# Patient Record
Sex: Female | Born: 1950 | Race: White | Hispanic: No | Marital: Married | Smoking: Current every day smoker
Health system: Southern US, Community
[De-identification: ages and names within clinical notes are randomized; demographics above are authoritative.]

## PROBLEM LIST (undated history)

## (undated) DIAGNOSIS — I10 Essential (primary) hypertension: Secondary | ICD-10-CM

## (undated) DIAGNOSIS — E785 Hyperlipidemia, unspecified: Secondary | ICD-10-CM

## (undated) DIAGNOSIS — Z972 Presence of dental prosthetic device (complete) (partial): Secondary | ICD-10-CM

## (undated) DIAGNOSIS — F419 Anxiety disorder, unspecified: Secondary | ICD-10-CM

## (undated) DIAGNOSIS — H9190 Unspecified hearing loss, unspecified ear: Secondary | ICD-10-CM

## (undated) DIAGNOSIS — R200 Anesthesia of skin: Secondary | ICD-10-CM

## (undated) DIAGNOSIS — M1990 Unspecified osteoarthritis, unspecified site: Secondary | ICD-10-CM

## (undated) HISTORY — PX: COLONOSCOPY: SHX174

## (undated) HISTORY — DX: Anxiety disorder, unspecified: F41.9

## (undated) HISTORY — DX: Hyperlipidemia, unspecified: E78.5

## (undated) HISTORY — DX: Unspecified osteoarthritis, unspecified site: M19.90

## (undated) HISTORY — DX: Essential (primary) hypertension: I10

---

## 1979-03-14 HISTORY — PX: TUBAL LIGATION: SHX77

## 2006-04-03 DIAGNOSIS — Z79890 Hormone replacement therapy (postmenopausal): Secondary | ICD-10-CM | POA: Insufficient documentation

## 2007-07-16 LAB — HM DEXA SCAN

## 2008-05-28 ENCOUNTER — Ambulatory Visit: Payer: Self-pay | Admitting: Family Medicine

## 2008-05-28 LAB — HM PAP SMEAR

## 2008-06-03 ENCOUNTER — Ambulatory Visit: Payer: Self-pay | Admitting: Family Medicine

## 2009-12-01 ENCOUNTER — Ambulatory Visit: Payer: Self-pay | Admitting: Family Medicine

## 2014-07-02 ENCOUNTER — Emergency Department
Admit: 2014-07-02 | Disposition: A | Discharge status: Home / Self Care | Payer: Self-pay | Admitting: Emergency Medicine

## 2014-07-02 LAB — BASIC METABOLIC PANEL
ANION GAP: 4 — AB (ref 7–16)
BUN: 13 mg/dL
CHLORIDE: 109 mmol/L
CREATININE: 0.64 mg/dL
Calcium, Total: 9.2 mg/dL
Co2: 27 mmol/L
EGFR (African American): >60
EGFR (Non-African Amer.): >60
Glucose: 100 mg/dL — ABNORMAL HIGH
Potassium: 3.8 mmol/L
Sodium: 140 mmol/L

## 2014-07-02 LAB — TROPONIN I: Troponin-I: <0.03 ng/mL

## 2014-12-29 ENCOUNTER — Ambulatory Visit (INDEPENDENT_AMBULATORY_CARE_PROVIDER_SITE_OTHER): Payer: Managed Care, Other (non HMO) | Admitting: Family Medicine

## 2014-12-29 ENCOUNTER — Ambulatory Visit
Admission: RE | Admit: 2014-12-29 | Discharge: 2014-12-29 | Disposition: A | Discharge status: Home / Self Care | Payer: 59 | Source: Ambulatory Visit | Attending: Family Medicine | Admitting: Family Medicine

## 2014-12-29 ENCOUNTER — Encounter: Payer: Self-pay | Admitting: Family Medicine

## 2014-12-29 VITALS — BP 160/90 | HR 76 | Temp 98.5°F | Resp 16 | Wt 132.6 lb

## 2014-12-29 DIAGNOSIS — R0989 Other specified symptoms and signs involving the circulatory and respiratory systems: Secondary | ICD-10-CM | POA: Insufficient documentation

## 2014-12-29 DIAGNOSIS — K2100 Gastro-esophageal reflux disease with esophagitis, without bleeding: Secondary | ICD-10-CM

## 2014-12-29 DIAGNOSIS — E7800 Pure hypercholesterolemia, unspecified: Secondary | ICD-10-CM

## 2014-12-29 DIAGNOSIS — R05 Cough: Secondary | ICD-10-CM | POA: Insufficient documentation

## 2014-12-29 DIAGNOSIS — R1310 Dysphagia, unspecified: Secondary | ICD-10-CM

## 2014-12-29 DIAGNOSIS — J440 Chronic obstructive pulmonary disease with acute lower respiratory infection: Secondary | ICD-10-CM

## 2014-12-29 DIAGNOSIS — J449 Chronic obstructive pulmonary disease, unspecified: Secondary | ICD-10-CM | POA: Insufficient documentation

## 2014-12-29 DIAGNOSIS — I10 Essential (primary) hypertension: Secondary | ICD-10-CM

## 2014-12-29 DIAGNOSIS — J329 Chronic sinusitis, unspecified: Secondary | ICD-10-CM

## 2014-12-29 DIAGNOSIS — Z720 Tobacco use: Secondary | ICD-10-CM

## 2014-12-29 DIAGNOSIS — F419 Anxiety disorder, unspecified: Secondary | ICD-10-CM

## 2014-12-29 MED ORDER — PROMETHAZINE-DM 6.25-15 MG/5ML PO SYRP: 5.0000 mL | ORAL_SOLUTION | Freq: Four times a day (QID) | ORAL | Status: DC | PRN

## 2014-12-29 MED ORDER — ALBUTEROL SULFATE HFA 108 (90 BASE) MCG/ACT IN AERS: 2.0000 | INHALATION_SPRAY | Freq: Four times a day (QID) | RESPIRATORY_TRACT | Status: DC | PRN

## 2014-12-29 MED ORDER — AZITHROMYCIN 250 MG PO TABS: ORAL_TABLET | ORAL | Status: DC

## 2014-12-29 NOTE — Progress Notes (Signed)
Patient ID: Amanda Nelson, female   DOB: 1950/10/23, 64 y.o.   MRN: [id number]       Patient: Amanda Nelson Female    DOB: [birth year]   64 y.o.   MRN: [id number] Visit Date: 12/29/2014  Today's Provider: Dortha Kern, PA   Chief Complaint  Patient presents with  . Cough    pt stated that she knows that she has bronchitis, pt started last week on 12/23/2014. Pt reports taking Alka-Seltzer plus, sudafed, zinc and vitamin C with no relief. pt reports no fever. unable to cough up the sputum. pt reports back and rib soreness from coughing.   Subjective:    Cough This is a new problem. The current episode started in the past 7 days. The problem has been gradually worsening. The problem occurs constantly. The cough is productive of sputum (yellow color). Associated symptoms include chills, headaches, nasal congestion, shortness of breath, sweats and wheezing. Pertinent negatives include no sore throat. Nothing aggravates the symptoms. Treatments tried: Alka-Seltzer plus, sudafed, zinc and vitamin C. The treatment provided no relief. Her past medical history is significant for bronchitis.   Patient Active Problem List   Diagnosis Date Noted  . Chronic sinusitis 12/29/2014  . Dysphagia 12/29/2014  . Esophagitis, reflux 12/29/2014  . Tobacco use 12/29/2014  . Anxiety disorder 12/29/2014  . Essential hypertension 12/29/2014  . Pure hypercholesterolemia 12/29/2014  . Need for prophylactic hormone replacement therapy (postmenopausal) 04/03/2006   Past Surgical History  Procedure Laterality Date  . Tubal ligation  1981   Family History  Problem Relation Age of Onset  . Stroke Brother   . Cancer Brother   . Cancer Sister     cancer   No Known Allergies  Previous Medications   OMEGA-3 FATTY ACIDS (FISH OIL PO)    Take by mouth.   Social History  Substance Use Topics  . Smoking status: Smoker, Current Status Unknown -- 1.00 packs/day for 30 years    Types: Cigarettes    . Smokeless tobacco: Never Used  . Alcohol Use: No    Review of Systems  Constitutional: Positive for chills.  HENT: Negative for sore throat.   Respiratory: Positive for cough, shortness of breath and wheezing.   Neurological: Positive for headaches.   Objective:   BP 160/90 mmHg  Pulse 76  Temp(Src) 98.5 F (36.9 C) (Oral)  Resp 16  Wt 132 lb 9.6 oz (60.147 kg)  SpO2 93%  Physical Exam  Constitutional: She is oriented to person, place, and time. She appears well-developed and well-nourished.  HENT:  Head: Normocephalic and atraumatic.  Right Ear: Tympanic membrane is retracted.  Left Ear: External ear normal.  Nose: Nose normal.  Mouth/Throat: Oropharynx is clear and moist.  Eyes: Conjunctivae are normal. Pupils are equal, round, and reactive to light.  Neck: Normal range of motion. Neck supple.  Cardiovascular: Normal rate, regular rhythm and normal heart sounds.   Pulmonary/Chest: She has rales.  Both posterior bases.  Abdominal: Soft. Bowel sounds are normal.  Musculoskeletal: Normal range of motion.  Lymphadenopathy:    She has no cervical adenopathy.  Neurological: She is alert and oriented to person, place, and time.  Psychiatric: She has a normal mood and affect. Her behavior is normal.      Assessment & Plan:     1. Rales Onset with cough and purulent sputum production the past week. Some chills initially but no fever today. Short of breath with exertion and urinary stress incontinence with cough.  Suspect basilar pneumonia versus acute exacerbation of COPD. Will start antibiotic, bronchodilator and cough syrup. Scheduled for CXR and CBC with diff. Recheck pending reports. Out of work for 2-3 days. - azithromycin (ZITHROMAX) 250 MG tablet; Two tablets by mouth today then one daily for 4 days  Dispense: 6 tablet; Refill: 0 - promethazine-dextromethorphan (PROMETHAZINE-DM) 6.25-15 MG/5ML syrup; Take 5 mLs by mouth 4 (four) times daily as needed for cough.   Dispense: 118 mL; Refill: 0 - CBC with Differential/Platelet - DG Chest 2 View  2. Chronic obstructive pulmonary disease with acute lower respiratory infection (HCC) Still smoking 1/2 to 1 ppd and has been for 37 years. Encouraged to stop all smoking and given bronchodilator to help control wheezing. - albuterol (PROVENTIL HFA;VENTOLIN HFA) 108 (90 BASE) MCG/ACT inhaler; Inhale 2 puffs into the lungs every 6 (six) hours as needed for wheezing or shortness of breath.  Dispense: 1 Inhaler; Refill: 3  3. Essential hypertension Has been off all medications for years. Went to ER on 07-02-14 for very high BP and was evaluated for possible stroke. States all tests were normal. Was given Lisinopril 5 mg qd but did not take it. Recommend she limit sodium intake and stop all smoking. Recheck BP in a week once congestion treated. Should not use decongestants and limit caffeine intake.       Dortha Kern, PA  [facility]

## 2014-12-30 LAB — CBC WITH DIFFERENTIAL/PLATELET
BASOS ABS: 0.1 10*3/uL (ref 0.0–0.2)
Basos: 1 %
EOS (ABSOLUTE): 0.2 10*3/uL (ref 0.0–0.4)
Eos: 2 %
Hematocrit: 42.5 % (ref 34.0–46.6)
Hemoglobin: 14.5 g/dL (ref 11.1–15.9)
Immature Grans (Abs): 0 10*3/uL (ref 0.0–0.1)
Immature Granulocytes: 0 %
LYMPHS ABS: 2.7 10*3/uL (ref 0.7–3.1)
Lymphs: 28 %
MCH: 31.6 pg (ref 26.6–33.0)
MCHC: 34.1 g/dL (ref 31.5–35.7)
MCV: 93 fL (ref 79–97)
Monocytes Absolute: 1 10*3/uL — ABNORMAL HIGH (ref 0.1–0.9)
Monocytes: 10 %
NEUTROS ABS: 5.8 10*3/uL (ref 1.4–7.0)
Neutrophils: 59 %
PLATELETS: 260 10*3/uL (ref 150–379)
RBC: 4.59 x10E6/uL (ref 3.77–5.28)
RDW: 14.2 % (ref 12.3–15.4)
WBC: 9.7 10*3/uL (ref 3.4–10.8)

## 2014-12-31 ENCOUNTER — Telehealth: Payer: Self-pay

## 2014-12-31 NOTE — Telephone Encounter (Signed)
Patient advised as directed below. Patient verbalized understanding and agrees with plan of care. Patient scheduled a follow up appointment for spirometry test on 01/12/2015.

## 2014-12-31 NOTE — Telephone Encounter (Signed)
-----   Message from [place], [place] sent at 12/31/2014  2:05 AM EDT ----- Normal blood cell counts. No sign of serious infection. Probably an acute exacerbation bronchitis due to COPD and smoking. Finish all the antibiotic and schedule for spirometry lung test.

## 2014-12-31 NOTE — Telephone Encounter (Signed)
-----   Message from [place], [place] sent at 12/29/2014  5:44 PM EDT ----- [place] shows a mild basilar opacity at the right lung base but no significant pneumonia. Take all the antibiotic and awaiting report of blood count.

## 2014-12-31 NOTE — Telephone Encounter (Signed)
-----   Message from [place], [place] sent at 12/29/2014  5:44 PM EDT ----- [place] shows a mild basilar opacity at the right lung base but no significant pneumonia. Take all the antibiotic and awaiting report of blood count.

## 2015-01-12 ENCOUNTER — Ambulatory Visit (INDEPENDENT_AMBULATORY_CARE_PROVIDER_SITE_OTHER): Payer: Managed Care, Other (non HMO) | Admitting: Family Medicine

## 2015-01-12 ENCOUNTER — Encounter: Payer: Self-pay | Admitting: Family Medicine

## 2015-01-12 VITALS — BP 130/78 | HR 70 | Temp 98.0°F | Resp 18 | Ht 60.0 in | Wt 136.0 lb

## 2015-01-12 DIAGNOSIS — J441 Chronic obstructive pulmonary disease with (acute) exacerbation: Secondary | ICD-10-CM | POA: Insufficient documentation

## 2015-01-12 DIAGNOSIS — J449 Chronic obstructive pulmonary disease, unspecified: Secondary | ICD-10-CM | POA: Insufficient documentation

## 2015-01-12 DIAGNOSIS — J440 Chronic obstructive pulmonary disease with acute lower respiratory infection: Secondary | ICD-10-CM

## 2015-01-12 MED ORDER — MOMETASONE FURO-FORMOTEROL FUM 100-5 MCG/ACT IN AERO: 2.0000 | INHALATION_SPRAY | Freq: Two times a day (BID) | RESPIRATORY_TRACT | Status: DC

## 2015-01-12 NOTE — Progress Notes (Signed)
Patient ID: Amanda Nelson, female   DOB: 04/30/1950, 64 y.o.   MRN: [id number]    Subjective:  HPI COPD:  Follow up cough due to exacerbation of COPD from an acute respiratory infection. Finished Z-pak and using less Promethazine-DM cough syrup. Has cut back on use of the Albuterol because she thought it was running out (mistook gram size of canister for the number of inhalations remaining). No fever and very little head congestion remaining. Cough and sputum production worse in the mornings. Sputum thick and white colored now.  Prior to Admission medications   Medication Sig Start Date End Date Taking? Authorizing Provider  albuterol (PROVENTIL HFA;VENTOLIN HFA) 108 (90 BASE) MCG/ACT inhaler Inhale 2 puffs into the lungs every 6 (six) hours as needed for wheezing or shortness of breath. 12/29/14  Yes Dennis E Chrismon, PA  Omega-3 Fatty Acids (FISH OIL PO) Take by mouth.   Yes Historical Provider, MD  esomeprazole (NEXIUM) 40 MG capsule Take 40 mg by mouth daily at 12 noon.    Historical Provider, MD  fluticasone (FLONASE) 50 MCG/ACT nasal spray Place 2 sprays into both nostrils at bedtime.    Historical Provider, MD  metoprolol tartrate (LOPRESSOR) 25 MG tablet Take 25 mg by mouth daily.    Historical Provider, MD  rosuvastatin (CRESTOR) 5 MG tablet Take 5 mg by mouth daily.    Historical Provider, MD   Patient Active Problem List   Diagnosis Date Noted  . Chronic sinusitis 12/29/2014  . Dysphagia 12/29/2014  . Esophagitis, reflux 12/29/2014  . Tobacco use 12/29/2014  . Anxiety disorder 12/29/2014  . Essential hypertension 12/29/2014  . Pure hypercholesterolemia 12/29/2014  . Need for prophylactic hormone replacement therapy (postmenopausal) 04/03/2006   Past Medical History  Diagnosis Date  . Anxiety   . Hypertension   . Hyperlipidemia    Social History   Social History  . Marital Status: Married    Spouse Name: N/A  . Number of Children: N/A  . Years of Education: N/A    Occupational History  . Not on file.   Social History Main Topics  . Smoking status: Current Every Day Smoker -- 1.00 packs/day for 30 years    Types: Cigarettes  . Smokeless tobacco: Never Used  . Alcohol Use: No  . Drug Use: No  . Sexual Activity: Not on file   Other Topics Concern  . Not on file   Social History Narrative   No Known Allergies  Review of Systems  Constitutional: Negative.   HENT: Negative.   Eyes: Negative.   Respiratory: Positive for cough.   Cardiovascular: Negative.   Gastrointestinal: Negative.   Genitourinary: Negative.   Musculoskeletal: Negative.   Skin: Negative.   Neurological: Negative.   Endo/Heme/Allergies: Negative.   Psychiatric/Behavioral: Negative.    Objective:  BP 130/78 mmHg  Pulse 70  Temp(Src) 98 F (36.7 C) (Oral)  Resp 18  Wt 136 lb (61.689 kg)  SpO2 98% Body mass index is 26.56 kg/(m^2).  Physical Exam  Constitutional: She is oriented to person, place, and time and well-developed, well-nourished, and in no distress.  Eyes: Conjunctivae and EOM are normal.  Neck: Neck supple.  Cardiovascular: Normal rate and regular rhythm.   Pulmonary/Chest: Effort normal and breath sounds normal. She has no rales.  Abdominal: Bowel sounds are normal.  Neurological: She is alert and oriented to person, place, and time.    Lab Results  Component Value Date   WBC 9.7 12/29/2014   HCT 42.5 12/29/2014  GLUCOSE 100* 07/02/2014   CMP     Component Value Date/Time   NA 140 07/02/2014 0926   K 3.8 07/02/2014 0926   CL 109 07/02/2014 0926   CO2 27 07/02/2014 0926   GLUCOSE 100* 07/02/2014 0926   BUN 13 07/02/2014 0926   CREATININE 0.64 07/02/2014 0926   CALCIUM 9.2 07/02/2014 0926   GFRNONAA >60 07/02/2014 0926   GFRAA >60 07/02/2014 0926   Assessment and Plan :   1. Chronic obstructive pulmonary disease with acute lower respiratory infection (HCC) Improvement in cough and congestion. Not having to use the Albuterol as  frequent now and has finished the Z-pak. Spirometry confirms severe airway obstructive disease with a lung age >84 years. Will add Dulera routinely and consider Spiriva pending follow up in a month. - Spirometry with graph - mometasone-formoterol (DULERA) 100-5 MCG/ACT AERO; Inhale 2 puffs into the lungs 2 (two) times daily.  Dispense: 8.8 g; Refill: 0   Dortha Kern PA [facility] 01/12/2015 8:22 AM

## 2015-01-12 NOTE — Patient Instructions (Signed)
Chronic Obstructive Pulmonary Disease Chronic obstructive pulmonary disease (COPD) is a common lung condition in which airflow from the lungs is limited. COPD is a general term that can be used to describe many different lung problems that limit airflow, including both chronic bronchitis and emphysema. If you have COPD, your lung function will probably never return to normal, but there are measures you can take to improve lung function and make yourself feel better. CAUSES   Smoking (common).  Exposure to secondhand smoke.  Genetic problems.  Chronic inflammatory lung diseases or recurrent infections. SYMPTOMS  Shortness of breath, especially with physical activity.  Deep, persistent (chronic) cough with a large amount of thick mucus.  Wheezing.  Rapid breaths (tachypnea).  Gray or bluish discoloration (cyanosis) of the skin, especially in your fingers, toes, or lips.  Fatigue.  Weight loss.  Frequent infections or episodes when breathing symptoms become much worse (exacerbations).  Chest tightness. DIAGNOSIS Your health care provider will take a medical history and perform a physical examination to diagnose COPD. Additional tests for COPD may include:  Lung (pulmonary) function tests.  Chest X-ray.  CT scan.  Blood tests. TREATMENT  Treatment for COPD may include:  Inhaler and nebulizer medicines. These help manage the symptoms of COPD and make your breathing more comfortable.  Supplemental oxygen. Supplemental oxygen is only helpful if you have a low oxygen level in your blood.  Exercise and physical activity. These are beneficial for nearly all people with COPD.  Lung surgery or transplant.  Nutrition therapy to gain weight, if you are underweight.  Pulmonary rehabilitation. This may involve working with a team of health care providers and specialists, such as respiratory, occupational, and physical therapists. HOME CARE INSTRUCTIONS  Take all medicines  (inhaled or pills) as directed by your health care provider.  Avoid over-the-counter medicines or cough syrups that dry up your airway (such as antihistamines) and slow down the elimination of secretions unless instructed otherwise by your health care provider.  If you are a smoker, the most important thing that you can do is stop smoking. Continuing to smoke will cause further lung damage and breathing trouble. Ask your health care provider for help with quitting smoking. He or she can direct you to community resources or hospitals that provide support.  Avoid exposure to irritants such as smoke, chemicals, and fumes that aggravate your breathing.  Use oxygen therapy and pulmonary rehabilitation if directed by your health care provider. If you require home oxygen therapy, ask your health care provider whether you should purchase a pulse oximeter to measure your oxygen level at home.  Avoid contact with individuals who have a contagious illness.  Avoid extreme temperature and humidity changes.  Eat healthy foods. Eating smaller, more frequent meals and resting before meals may help you maintain your strength.  Stay active, but balance activity with periods of rest. Exercise and physical activity will help you maintain your ability to do things you want to do.  Preventing infection and hospitalization is very important when you have COPD. Make sure to receive all the vaccines your health care provider recommends, especially the pneumococcal and influenza vaccines. Ask your health care provider whether you need a pneumonia vaccine.  Learn and use relaxation techniques to manage stress.  Learn and use controlled breathing techniques as directed by your health care provider. Controlled breathing techniques include:  Pursed lip breathing. Start by breathing in (inhaling) through your nose for 1 second. Then, purse your lips as if you were   going to whistle and breathe out (exhale) through the  pursed lips for 2 seconds.  Diaphragmatic breathing. Start by putting one hand on your abdomen just above your waist. Inhale slowly through your nose. The hand on your abdomen should move out. Then purse your lips and exhale slowly. You should be able to feel the hand on your abdomen moving in as you exhale.  Learn and use controlled coughing to clear mucus from your lungs. Controlled coughing is a series of short, progressive coughs. The steps of controlled coughing are: 1. Lean your head slightly forward. 2. Breathe in deeply using diaphragmatic breathing. 3. Try to hold your breath for 3 seconds. 4. Keep your mouth slightly open while coughing twice. 5. Spit any mucus out into a tissue. 6. Rest and repeat the steps once or twice as needed. SEEK MEDICAL CARE IF:  You are coughing up more mucus than usual.  There is a change in the color or thickness of your mucus.  Your breathing is more labored than usual.  Your breathing is faster than usual. SEEK IMMEDIATE MEDICAL CARE IF:  You have shortness of breath while you are resting.  You have shortness of breath that prevents you from:  Being able to talk.  Performing your usual physical activities.  You have chest pain lasting longer than 5 minutes.  Your skin color is more cyanotic than usual.  You measure low oxygen saturations for longer than 5 minutes with a pulse oximeter. MAKE SURE YOU:  Understand these instructions.  Will watch your condition.  Will get help right away if you are not doing well or get worse.   This information is not intended to replace advice given to you by your health care provider. Make sure you discuss any questions you have with your health care provider.   Document Released: 12/07/2004 Document Revised: 03/20/2014 Document Reviewed: 10/24/2012 Elsevier Interactive Patient Education 2016 [employer]  

## 2015-02-16 ENCOUNTER — Encounter: Payer: Self-pay | Admitting: Family Medicine

## 2015-02-16 ENCOUNTER — Ambulatory Visit (INDEPENDENT_AMBULATORY_CARE_PROVIDER_SITE_OTHER): Payer: Managed Care, Other (non HMO) | Admitting: Family Medicine

## 2015-02-16 VITALS — BP 148/80 | HR 78 | Temp 98.0°F | Resp 16 | Wt 137.2 lb

## 2015-02-16 DIAGNOSIS — J449 Chronic obstructive pulmonary disease, unspecified: Secondary | ICD-10-CM

## 2015-02-16 DIAGNOSIS — I10 Essential (primary) hypertension: Secondary | ICD-10-CM

## 2015-02-16 DIAGNOSIS — E7800 Pure hypercholesterolemia, unspecified: Secondary | ICD-10-CM | POA: Diagnosis not present

## 2015-02-16 MED ORDER — TIOTROPIUM BROMIDE MONOHYDRATE 18 MCG IN CAPS: 18.0000 ug | ORAL_CAPSULE | Freq: Every day | RESPIRATORY_TRACT | Status: DC

## 2015-02-16 MED ORDER — LISINOPRIL 5 MG PO TABS: 5.0000 mg | ORAL_TABLET | Freq: Every day | ORAL | Status: DC

## 2015-02-16 NOTE — Progress Notes (Signed)
Patient ID: Amanda Nelson, female   DOB: [birth year], 64 y.o.   MRN: [id number]   Chief Complaint  Patient presents with  . COPD  . Follow-up    Subjective:  HPI  Much improved with only slight sputum production in the early mornings. Felt the [facility] caused a "jittery" sensation and stopped using it. Occasionally uses the Albuterol which helps clear sputum. Denies wheezing. Some shortness of breath when climbing stairs but resolves quickly. Down to smoking 1/2 ppd while at work and trying not to smoke in her house.  Prior to Admission medications   Medication Sig Start Date End Date Taking? Authorizing Provider  albuterol (PROVENTIL HFA;VENTOLIN HFA) 108 (90 BASE) MCG/ACT inhaler Inhale 2 puffs into the lungs every 6 (six) hours as needed for wheezing or shortness of breath. 12/29/14  Yes Dennis E Chrismon, PA  mometasone-formoterol (DULERA) 100-5 MCG/ACT AERO Inhale 2 puffs into the lungs 2 (two) times daily. 01/12/15  Yes Tamsen Roers, PA    Patient Active Problem List   Diagnosis Date Noted  . COPD (chronic obstructive pulmonary disease) (HCC) 01/12/2015  . Chronic sinusitis 12/29/2014  . Dysphagia 12/29/2014  . Esophagitis, reflux 12/29/2014  . Tobacco use 12/29/2014  . Anxiety disorder 12/29/2014  . Essential hypertension 12/29/2014  . Pure hypercholesterolemia 12/29/2014  . Need for prophylactic hormone replacement therapy (postmenopausal) 04/03/2006    Past Medical History  Diagnosis Date  . Anxiety   . Hypertension   . Hyperlipidemia     Social History   Social History  . Marital Status: Married    Spouse Name: N/A  . Number of Children: N/A  . Years of Education: N/A   Occupational History  . Not on file.   Social History Main Topics  . Smoking status: Current Every Day Smoker -- 1.00 packs/day for 30 years    Types: Cigarettes  . Smokeless tobacco: Never Used  . Alcohol Use: No  . Drug Use: No  . Sexual Activity: Not on file   Other Topics  Concern  . Not on file   Social History Narrative    No Known Allergies  Review of Systems  Constitutional: Negative.   HENT: Negative.   Eyes: Negative.   Respiratory: Positive for sputum production and shortness of breath.        Sputum production each morning. Dyspnea with climbing stairs.  Cardiovascular: Negative.  Negative for chest pain.  Gastrointestinal: Negative.   Genitourinary: Negative.   Skin: Negative.   Neurological: Negative.   Endo/Heme/Allergies: Negative.   Psychiatric/Behavioral: Negative.      There is no immunization history on file for this patient. Objective:  BP 148/80 mmHg  Pulse 78  Temp(Src) 98 F (36.7 C) (Oral)  Resp 16  Wt 137 lb 3.2 oz (62.234 kg)  SpO2 97%  Physical Exam  Constitutional: She is oriented to person, place, and time and well-developed, well-nourished, and in no distress.  HENT:  Head: Normocephalic.  Eyes: Conjunctivae are normal.  Neck: Neck supple.  Cardiovascular: Normal rate and regular rhythm.   Pulmonary/Chest: Effort normal. She has no wheezes. She has no rales.  Abdominal: Soft. Bowel sounds are normal.  Neurological: She is alert and oriented to person, place, and time.    Lab Results  Component Value Date   WBC 9.7 12/29/2014   HCT 42.5 12/29/2014   GLUCOSE 100* 07/02/2014    CMP     Component Value Date/Time   NA 140 07/02/2014 0926   K 3.8  07/02/2014 0926   CL 109 07/02/2014 0926   CO2 27 07/02/2014 0926   GLUCOSE 100* 07/02/2014 0926   BUN 13 07/02/2014 0926   CREATININE 0.64 07/02/2014 0926   CALCIUM 9.2 07/02/2014 0926   GFRNONAA >60 07/02/2014 0926   GFRAA >60 07/02/2014 0926    Assessment and Plan :  1. Chronic obstructive pulmonary disease, unspecified COPD type (HCC) Improved since treatment of acute URI. Felt jittery with use of the [facility] and stopped it. Occasional use of Albuterol. Will add Spiriva once a day and encourage to stop all smoking. Recheck in 3 months. - tiotropium  (SPIRIVA) 18 MCG inhalation capsule; Place 1 capsule (18 mcg total) into inhaler and inhale daily.  Dispense: 30 capsule; Refill: 12  2. Essential hypertension Was given Lisinopril for hypertension at the ER in April 2016. Has not used it routinely but notices systolic pressure staying above 140. Refilled Lisinopril and will get routine follow up labs. Recheck pending reports. - CBC with Differential/Platelet - lisinopril (PRINIVIL,ZESTRIL) 5 MG tablet; Take 1 tablet (5 mg total) by mouth daily.  Dispense: 30 tablet; Refill: 6  3. Pure hypercholesterolemia No longer taking the Omega-3 or Crestor. Will recheck labs and encouraged to follow low fat diet. - COMPLETE METABOLIC PANEL WITH GFR - Lipid panel - TSH   Dortha Kern PA [facility] 02/16/2015 8:15 AM

## 2015-02-16 NOTE — Patient Instructions (Signed)
Chronic Obstructive Pulmonary Disease Chronic obstructive pulmonary disease (COPD) is a common lung condition in which airflow from the lungs is limited. COPD is a general term that can be used to describe many different lung problems that limit airflow, including both chronic bronchitis and emphysema. If you have COPD, your lung function will probably never return to normal, but there are measures you can take to improve lung function and make yourself feel better. CAUSES   Smoking (common).  Exposure to secondhand smoke.  Genetic problems.  Chronic inflammatory lung diseases or recurrent infections. SYMPTOMS  Shortness of breath, especially with physical activity.  Deep, persistent (chronic) cough with a large amount of thick mucus.  Wheezing.  Rapid breaths (tachypnea).  Gray or bluish discoloration (cyanosis) of the skin, especially in your fingers, toes, or lips.  Fatigue.  Weight loss.  Frequent infections or episodes when breathing symptoms become much worse (exacerbations).  Chest tightness. DIAGNOSIS Your health care provider will take a medical history and perform a physical examination to diagnose COPD. Additional tests for COPD may include:  Lung (pulmonary) function tests.  Chest X-ray.  CT scan.  Blood tests. TREATMENT  Treatment for COPD may include:  Inhaler and nebulizer medicines. These help manage the symptoms of COPD and make your breathing more comfortable.  Supplemental oxygen. Supplemental oxygen is only helpful if you have a low oxygen level in your blood.  Exercise and physical activity. These are beneficial for nearly all people with COPD.  Lung surgery or transplant.  Nutrition therapy to gain weight, if you are underweight.  Pulmonary rehabilitation. This may involve working with a team of health care providers and specialists, such as respiratory, occupational, and physical therapists. HOME CARE INSTRUCTIONS  Take all medicines  (inhaled or pills) as directed by your health care provider.  Avoid over-the-counter medicines or cough syrups that dry up your airway (such as antihistamines) and slow down the elimination of secretions unless instructed otherwise by your health care provider.  If you are a smoker, the most important thing that you can do is stop smoking. Continuing to smoke will cause further lung damage and breathing trouble. Ask your health care provider for help with quitting smoking. He or she can direct you to community resources or hospitals that provide support.  Avoid exposure to irritants such as smoke, chemicals, and fumes that aggravate your breathing.  Use oxygen therapy and pulmonary rehabilitation if directed by your health care provider. If you require home oxygen therapy, ask your health care provider whether you should purchase a pulse oximeter to measure your oxygen level at home.  Avoid contact with individuals who have a contagious illness.  Avoid extreme temperature and humidity changes.  Eat healthy foods. Eating smaller, more frequent meals and resting before meals may help you maintain your strength.  Stay active, but balance activity with periods of rest. Exercise and physical activity will help you maintain your ability to do things you want to do.  Preventing infection and hospitalization is very important when you have COPD. Make sure to receive all the vaccines your health care provider recommends, especially the pneumococcal and influenza vaccines. Ask your health care provider whether you need a pneumonia vaccine.  Learn and use relaxation techniques to manage stress.  Learn and use controlled breathing techniques as directed by your health care provider. Controlled breathing techniques include:  Pursed lip breathing. Start by breathing in (inhaling) through your nose for 1 second. Then, purse your lips as if you were   going to whistle and breathe out (exhale) through the  pursed lips for 2 seconds.  Diaphragmatic breathing. Start by putting one hand on your abdomen just above your waist. Inhale slowly through your nose. The hand on your abdomen should move out. Then purse your lips and exhale slowly. You should be able to feel the hand on your abdomen moving in as you exhale.  Learn and use controlled coughing to clear mucus from your lungs. Controlled coughing is a series of short, progressive coughs. The steps of controlled coughing are: 1. Lean your head slightly forward. 2. Breathe in deeply using diaphragmatic breathing. 3. Try to hold your breath for 3 seconds. 4. Keep your mouth slightly open while coughing twice. 5. Spit any mucus out into a tissue. 6. Rest and repeat the steps once or twice as needed. SEEK MEDICAL CARE IF:  You are coughing up more mucus than usual.  There is a change in the color or thickness of your mucus.  Your breathing is more labored than usual.  Your breathing is faster than usual. SEEK IMMEDIATE MEDICAL CARE IF:  You have shortness of breath while you are resting.  You have shortness of breath that prevents you from:  Being able to talk.  Performing your usual physical activities.  You have chest pain lasting longer than 5 minutes.  Your skin color is more cyanotic than usual.  You measure low oxygen saturations for longer than 5 minutes with a pulse oximeter. MAKE SURE YOU:  Understand these instructions.  Will watch your condition.  Will get help right away if you are not doing well or get worse.   This information is not intended to replace advice given to you by your health care provider. Make sure you discuss any questions you have with your health care provider.   Document Released: 12/07/2004 Document Revised: 03/20/2014 Document Reviewed: 10/24/2012 Elsevier Interactive Patient Education 2016 [employer]. Hypertension Hypertension, commonly called high blood pressure, is when the force of  blood pumping through your arteries is too strong. Your arteries are the blood vessels that carry blood from your heart throughout your body. A blood pressure reading consists of a higher number over a lower number, such as 110/72. The higher number (systolic) is the pressure inside your arteries when your heart pumps. The lower number (diastolic) is the pressure inside your arteries when your heart relaxes. Ideally you want your blood pressure below 120/80. Hypertension forces your heart to work harder to pump blood. Your arteries may become narrow or stiff. Having untreated or uncontrolled hypertension can cause heart attack, stroke, kidney disease, and other problems. RISK FACTORS Some risk factors for high blood pressure are controllable. Others are not.  Risk factors you cannot control include:   Race. You may be at higher risk if you are African American.  Age. Risk increases with age.  Gender. Men are at higher risk than women before age 66 years. After age 46, women are at higher risk than men. Risk factors you can control include:  Not getting enough exercise or physical activity.  Being overweight.  Getting too much fat, sugar, calories, or salt in your diet.  Drinking too much alcohol. SIGNS AND SYMPTOMS Hypertension does not usually cause signs or symptoms. Extremely high blood pressure (hypertensive crisis) may cause headache, anxiety, shortness of breath, and nosebleed. DIAGNOSIS To check if you have hypertension, your health care provider will measure your blood pressure while you are seated, with your arm held at the  level of your heart. It should be measured at least twice using the same arm. Certain conditions can cause a difference in blood pressure between your right and left arms. A blood pressure reading that is higher than normal on one occasion does not mean that you need treatment. If it is not clear whether you have high blood pressure, you may be asked to return on a  different day to have your blood pressure checked again. Or, you may be asked to monitor your blood pressure at home for 1 or more weeks. TREATMENT Treating high blood pressure includes making lifestyle changes and possibly taking medicine. Living a healthy lifestyle can help lower high blood pressure. You may need to change some of your habits. Lifestyle changes may include:  Following the DASH diet. This diet is high in fruits, vegetables, and whole grains. It is low in salt, red meat, and added sugars.  Keep your sodium intake below 2,300 mg per day.  Getting at least 30-45 minutes of aerobic exercise at least 4 times per week.  Losing weight if necessary.  Not smoking.  Limiting alcoholic beverages.  Learning ways to reduce stress. Your health care provider may prescribe medicine if lifestyle changes are not enough to get your blood pressure under control, and if one of the following is true:  You are 70-23 years of age and your systolic blood pressure is above 140.  You are 52 years of age or older, and your systolic blood pressure is above 150.  Your diastolic blood pressure is above 90.  You have diabetes, and your systolic blood pressure is over [id number] or your diastolic blood pressure is over 90.  You have kidney disease and your blood pressure is above 140/90.  You have heart disease and your blood pressure is above 140/90. Your personal target blood pressure may vary depending on your medical conditions, your age, and other factors. HOME CARE INSTRUCTIONS  Have your blood pressure rechecked as directed by your health care provider.   Take medicines only as directed by your health care provider. Follow the directions carefully. Blood pressure medicines must be taken as prescribed. The medicine does not work as well when you skip doses. Skipping doses also puts you at risk for problems.  Do not smoke.   Monitor your blood pressure at home as directed by your health care  provider. SEEK MEDICAL CARE IF:  7. You think you are having a reaction to medicines taken. 8. You have recurrent headaches or feel dizzy. 9. You have swelling in your ankles. 10. You have trouble with your vision. SEEK IMMEDIATE MEDICAL CARE IF:  You develop a severe headache or confusion.  You have unusual weakness, numbness, or feel faint.  You have severe chest or abdominal pain.  You vomit repeatedly.  You have trouble breathing. MAKE SURE YOU:   Understand these instructions.  Will watch your condition.  Will get help right away if you are not doing well or get worse.   This information is not intended to replace advice given to you by your health care provider. Make sure you discuss any questions you have with your health care provider.   Document Released: 02/27/2005 Document Revised: 07/14/2014 Document Reviewed: 12/20/2012 Elsevier Interactive Patient Education [employer].

## 2015-02-19 LAB — COMPREHENSIVE METABOLIC PANEL
ALK PHOS: 65 IU/L (ref 39–117)
ALT: 12 IU/L (ref 0–32)
AST: 16 IU/L (ref 0–40)
Albumin/Globulin Ratio: 1.9 (ref 1.1–2.5)
Albumin: 4.3 g/dL (ref 3.6–4.8)
BUN/Creatinine Ratio: 20 (ref 11–26)
BUN: 12 mg/dL (ref 8–27)
Bilirubin Total: 0.3 mg/dL (ref 0.0–1.2)
CO2: 22 mmol/L (ref 18–29)
Calcium: 9.4 mg/dL (ref 8.7–10.3)
Chloride: 103 mmol/L (ref 97–106)
Creatinine, Ser: 0.59 mg/dL (ref 0.57–1.00)
GFR calc Af Amer: 113 mL/min/{1.73_m2} (ref >59)
GFR, EST NON AFRICAN AMERICAN: 98 mL/min/{1.73_m2} (ref >59)
GLOBULIN, TOTAL: 2.3 g/dL (ref 1.5–4.5)
Glucose: 90 mg/dL (ref 65–99)
POTASSIUM: 4.3 mmol/L (ref 3.5–5.2)
SODIUM: 142 mmol/L (ref 136–144)
Total Protein: 6.6 g/dL (ref 6.0–8.5)

## 2015-02-19 LAB — CBC WITH DIFFERENTIAL/PLATELET
BASOS ABS: 0.1 10*3/uL (ref 0.0–0.2)
Basos: 1 %
EOS (ABSOLUTE): 0.2 10*3/uL (ref 0.0–0.4)
EOS: 2 %
Hematocrit: 41.5 % (ref 34.0–46.6)
Hemoglobin: 14.1 g/dL (ref 11.1–15.9)
IMMATURE GRANS (ABS): 0 10*3/uL (ref 0.0–0.1)
Immature Granulocytes: 0 %
LYMPHS: 35 %
Lymphocytes Absolute: 2.2 10*3/uL (ref 0.7–3.1)
MCH: 31.4 pg (ref 26.6–33.0)
MCHC: 34 g/dL (ref 31.5–35.7)
MCV: 92 fL (ref 79–97)
MONOCYTES: 9 %
Monocytes Absolute: 0.6 10*3/uL (ref 0.1–0.9)
NEUTROS ABS: 3.2 10*3/uL (ref 1.4–7.0)
Neutrophils: 53 %
Platelets: 191 10*3/uL (ref 150–379)
RBC: 4.49 x10E6/uL (ref 3.77–5.28)
RDW: 14.7 % (ref 12.3–15.4)
WBC: 6.3 10*3/uL (ref 3.4–10.8)

## 2015-02-19 LAB — LIPID PANEL
CHOLESTEROL TOTAL: 269 mg/dL — AB (ref 100–199)
Chol/HDL Ratio: 3.7 ratio units (ref 0.0–4.4)
HDL: 73 mg/dL (ref >39)
LDL Calculated: 179 mg/dL — ABNORMAL HIGH (ref 0–99)
TRIGLYCERIDES: 84 mg/dL (ref 0–149)
VLDL Cholesterol Cal: 17 mg/dL (ref 5–40)

## 2015-02-19 LAB — TSH: TSH: 1.22 u[IU]/mL (ref 0.450–4.500)

## 2015-02-22 ENCOUNTER — Telehealth: Payer: Self-pay

## 2015-02-22 NOTE — Telephone Encounter (Signed)
-----   Message from Margo Common, [place] sent at 02/21/2015  9:27 PM EST ----- All blood tests essentially normal except total and LDL cholesterol high again. Need to get back on Pravachol 20 mg QD #30 & 3 refills. Recheck levels in 3 months to check progress.

## 2015-02-22 NOTE — Telephone Encounter (Deleted)
-----   Message from Margo Common, [place] sent at 02/21/2015  9:27 PM EST ----- All blood tests essentially normal except total and LDL cholesterol high again. Need to get back on Pravachol 20 mg QD #30 & 3 refills. Recheck levels in 3 months to check progress.

## 2015-02-22 NOTE — Telephone Encounter (Signed)
Left message to call back  

## 2015-02-24 MED ORDER — PRAVASTATIN SODIUM 20 MG PO TABS: 20.0000 mg | ORAL_TABLET | Freq: Every day | ORAL | Status: DC

## 2015-02-24 NOTE — Telephone Encounter (Signed)
Patient advised as directed below. Patient verbalized understanding and agrees with plan of care. RX sent to pharmacy. Patient has a follow up appointment scheduled.

## 2015-02-24 NOTE — Telephone Encounter (Signed)
LMTCB

## 2015-05-18 ENCOUNTER — Ambulatory Visit (INDEPENDENT_AMBULATORY_CARE_PROVIDER_SITE_OTHER): Payer: Managed Care, Other (non HMO) | Admitting: Family Medicine

## 2015-05-18 ENCOUNTER — Encounter: Payer: Self-pay | Admitting: Family Medicine

## 2015-05-18 VITALS — BP 122/60 | Temp 98.0°F | Wt 138.0 lb

## 2015-05-18 DIAGNOSIS — R1310 Dysphagia, unspecified: Secondary | ICD-10-CM | POA: Diagnosis not present

## 2015-05-18 DIAGNOSIS — I10 Essential (primary) hypertension: Secondary | ICD-10-CM

## 2015-05-18 DIAGNOSIS — J449 Chronic obstructive pulmonary disease, unspecified: Secondary | ICD-10-CM

## 2015-05-18 DIAGNOSIS — E7800 Pure hypercholesterolemia, unspecified: Secondary | ICD-10-CM | POA: Diagnosis not present

## 2015-05-18 NOTE — Progress Notes (Signed)
Patient ID: NATALLE KUHN, female   DOB: 24-Jul-1950, 65 y.o.   MRN: [id number]       Patient: Amanda Nelson Female    DOB: April 09, 1950   65 y.o.   MRN: [id number] Visit Date: 05/18/2015  Today's Provider: Dortha Kern, PA   Chief Complaint  Patient presents with  . COPD  . Hyperlipidemia  . Hypertension   Subjective:    Hyperlipidemia This is a chronic problem. Associated symptoms include shortness of breath. Pertinent negatives include no chest pain, focal weakness, leg pain or myalgias. Current antihyperlipidemic treatment includes statins. There are no compliance problems.  Risk factors for coronary artery disease include hypertension.  Hypertension This is a chronic problem. The problem is controlled. Associated symptoms include shortness of breath. Pertinent negatives include no blurred vision, chest pain, headaches, neck pain, palpitations or peripheral edema. Risk factors for coronary artery disease include smoking/tobacco exposure and dyslipidemia. There are no compliance problems.   COPD, Follow Up: Patient comes in today for 3 month follow up on COPD. She reports that on last visit she was started on Spiriva. She reports that she does not notice much difference in her breathing. Patient denies any side effects, and she reports that she takes her medication daily. No longer using Albuterol or Dulera. Spirometry on 01-12-15 showed severe airway obstruction. Still smoking 1ppd.  Past Medical History  Diagnosis Date  . Anxiety   . Hypertension   . Hyperlipidemia    Patient Active Problem List   Diagnosis Date Noted  . COPD (chronic obstructive pulmonary disease) (HCC) 01/12/2015  . Chronic sinusitis 12/29/2014  . Dysphagia 12/29/2014  . Esophagitis, reflux 12/29/2014  . Tobacco use 12/29/2014  . Anxiety disorder 12/29/2014  . Essential hypertension 12/29/2014  . Pure hypercholesterolemia 12/29/2014  . Need for prophylactic hormone replacement therapy  (postmenopausal) 04/03/2006   Past Surgical History  Procedure Laterality Date  . Tubal ligation  1981   Family History  Problem Relation Age of Onset  . Stroke Brother   . Cancer Brother   . Cancer Sister     cancer   No Known Allergies   Previous Medications   ALBUTEROL (PROVENTIL HFA;VENTOLIN HFA) 108 (90 BASE) MCG/ACT INHALER    Inhale 2 puffs into the lungs every 6 (six) hours as needed for wheezing or shortness of breath.   LISINOPRIL (PRINIVIL,ZESTRIL) 5 MG TABLET    Take 1 tablet (5 mg total) by mouth daily.   MOMETASONE-FORMOTEROL (DULERA) 100-5 MCG/ACT AERO    Inhale 2 puffs into the lungs 2 (two) times daily.   PRAVASTATIN (PRAVACHOL) 20 MG TABLET    Take 1 tablet (20 mg total) by mouth daily.   ROSUVASTATIN (CRESTOR) 5 MG TABLET    Take 5 mg by mouth daily.   TIOTROPIUM (SPIRIVA) 18 MCG INHALATION CAPSULE    Place 1 capsule (18 mcg total) into inhaler and inhale daily.    Review of Systems  Constitutional: Negative.   Eyes: Negative for blurred vision.  Respiratory: Positive for shortness of breath.        With exertion.  Cardiovascular: Negative.  Negative for chest pain and palpitations.  Gastrointestinal:       Difficulty swallowing and frequent regurgitation of food at home or in restaurants.  Musculoskeletal: Negative.  Negative for myalgias and neck pain.  Neurological: Negative.  Negative for focal weakness and headaches.    Social History  Substance Use Topics  . Smoking status: Current Every Day Smoker -- 1.00 packs/day for  30 years    Types: Cigarettes  . Smokeless tobacco: Never Used  . Alcohol Use: No   Objective:   BP 122/60 mmHg  Temp(Src) 98 F (36.7 C)  Wt 138 lb (62.596 kg)  Physical Exam  Constitutional: She is oriented to person, place, and time. She appears well-developed and well-nourished. No distress.  HENT:  Head: Normocephalic and atraumatic.  Right Ear: Hearing and external ear normal.  Left Ear: Hearing and external ear  normal.  Nose: Nose normal.  Mouth/Throat: Oropharynx is clear and moist.  Eyes: Conjunctivae, EOM and lids are normal. Right eye exhibits no discharge. Left eye exhibits no discharge. No scleral icterus.  Neck: Normal range of motion. Neck supple.  Cardiovascular: Normal rate, regular rhythm and normal heart sounds.   Pulmonary/Chest: Effort normal. No respiratory distress.  Distant breath sounds.  Abdominal: Soft. Bowel sounds are normal.  Musculoskeletal: Normal range of motion.  Neurological: She is alert and oriented to person, place, and time.  Skin: Skin is intact. No lesion and no rash noted.  Psychiatric: She has a normal mood and affect. Her speech is normal and behavior is normal. Thought content normal.      Assessment & Plan:     1. Chronic obstructive pulmonary disease, unspecified COPD type (HCC) Still having some DOE (especially climbing stairs) but has not stopped smoking. Spirometry shows great improvement with use of Spiriva. No wheezing or cough with sputum production. Stopped using the Albuterol and Dulera once the bronchitis cleared. Recommend she continue the Spiriva and recheck in 3 months - Spirometry with graph  2. Pure hypercholesterolemia Tolerating the Pravastatin but concerned some of her muscle aches could be due to this. Will check labs and encouraged to drink extra fluids. Follow low fat diet. - Comprehensive metabolic panel - Lipid panel - CK (Creatine Kinase)  3. Essential hypertension Stable and well controlled BP. Tolerating the Lisinopril without side effects. Encouraged to stop all smoking and will recheck labs. - CBC with Differential/Platelet  4. Dysphagia Frequently having difficulty swallowing with dyspepsia and regurgitation. May use OTC H2 blocker or PPI. Schedule GI referral to rule out esophageal stricture and need for dilation. - Ambulatory referral to Gastroenterology       Dortha Kern, PA  [facility]

## 2015-05-19 LAB — COMPREHENSIVE METABOLIC PANEL
ALT: 11 IU/L (ref 0–32)
AST: 17 IU/L (ref 0–40)
Albumin/Globulin Ratio: 1.7 (ref 1.1–2.5)
Albumin: 4.3 g/dL (ref 3.6–4.8)
Alkaline Phosphatase: 61 IU/L (ref 39–117)
BUN/Creatinine Ratio: 15 (ref 11–26)
BUN: 9 mg/dL (ref 8–27)
Bilirubin Total: 0.3 mg/dL (ref 0.0–1.2)
CO2: 23 mmol/L (ref 18–29)
Calcium: 9.7 mg/dL (ref 8.7–10.3)
Chloride: 102 mmol/L (ref 96–106)
Creatinine, Ser: 0.61 mg/dL (ref 0.57–1.00)
GFR calc Af Amer: 112 mL/min/{1.73_m2} (ref >59)
GFR calc non Af Amer: 97 mL/min/{1.73_m2} (ref >59)
Globulin, Total: 2.5 g/dL (ref 1.5–4.5)
Glucose: 88 mg/dL (ref 65–99)
Potassium: 4.4 mmol/L (ref 3.5–5.2)
Sodium: 141 mmol/L (ref 134–144)
Total Protein: 6.8 g/dL (ref 6.0–8.5)

## 2015-05-19 LAB — CBC WITH DIFFERENTIAL/PLATELET
Basophils Absolute: 0 10*3/uL (ref 0.0–0.2)
Basos: 1 %
EOS (ABSOLUTE): 0.1 10*3/uL (ref 0.0–0.4)
Eos: 2 %
Hematocrit: 38.9 % (ref 34.0–46.6)
Hemoglobin: 13.1 g/dL (ref 11.1–15.9)
Immature Grans (Abs): 0 10*3/uL (ref 0.0–0.1)
Immature Granulocytes: 0 %
Lymphocytes Absolute: 2 10*3/uL (ref 0.7–3.1)
Lymphs: 32 %
MCH: 31.2 pg (ref 26.6–33.0)
MCHC: 33.7 g/dL (ref 31.5–35.7)
MCV: 93 fL (ref 79–97)
Monocytes Absolute: 0.6 10*3/uL (ref 0.1–0.9)
Monocytes: 10 %
Neutrophils Absolute: 3.6 10*3/uL (ref 1.4–7.0)
Neutrophils: 55 %
Platelets: 203 10*3/uL (ref 150–379)
RBC: 4.2 x10E6/uL (ref 3.77–5.28)
RDW: 14.6 % (ref 12.3–15.4)
WBC: 6.4 10*3/uL (ref 3.4–10.8)

## 2015-05-19 LAB — CK: CK TOTAL: 64 U/L (ref 24–173)

## 2015-05-19 LAB — LIPID PANEL
Chol/HDL Ratio: 3.1 ratio units (ref 0.0–4.4)
Cholesterol, Total: 225 mg/dL — ABNORMAL HIGH (ref 100–199)
HDL: 72 mg/dL (ref >39)
LDL Calculated: 138 mg/dL — ABNORMAL HIGH (ref 0–99)
Triglycerides: 74 mg/dL (ref 0–149)
VLDL Cholesterol Cal: 15 mg/dL (ref 5–40)

## 2015-05-20 ENCOUNTER — Telehealth: Payer: Self-pay

## 2015-05-20 MED ORDER — PRAVASTATIN SODIUM 20 MG PO TABS: 20.0000 mg | ORAL_TABLET | Freq: Every day | ORAL | Status: DC

## 2015-05-20 NOTE — Telephone Encounter (Signed)
Patient advised as directed below. Patient verbalized understanding and agrees with plan of care. RX sent to pharmacy.  

## 2015-05-20 NOTE — Telephone Encounter (Signed)
-----   Message from Margo Common, [place] sent at 05/20/2015  1:56 PM EST ----- Cholesterol improving with the use of the pravastatin. Remainder of blood tests are normal. Continue Pravastatin 20 mg qd #30 & 3 RF and recheck appointment in 3 months.

## 2015-05-25 ENCOUNTER — Other Ambulatory Visit: Payer: Self-pay | Admitting: Physician Assistant

## 2015-05-25 DIAGNOSIS — R1319 Other dysphagia: Secondary | ICD-10-CM

## 2015-05-25 DIAGNOSIS — K219 Gastro-esophageal reflux disease without esophagitis: Secondary | ICD-10-CM

## 2015-05-25 DIAGNOSIS — R1013 Epigastric pain: Secondary | ICD-10-CM

## 2015-06-01 ENCOUNTER — Ambulatory Visit
Admission: RE | Admit: 2015-06-01 | Discharge: 2015-06-01 | Disposition: A | Discharge status: Home / Self Care | Payer: Managed Care, Other (non HMO) | Source: Ambulatory Visit | Attending: Physician Assistant | Admitting: Physician Assistant

## 2015-06-01 DIAGNOSIS — R1319 Other dysphagia: Secondary | ICD-10-CM | POA: Insufficient documentation

## 2015-06-01 DIAGNOSIS — R1013 Epigastric pain: Secondary | ICD-10-CM

## 2015-06-01 DIAGNOSIS — K219 Gastro-esophageal reflux disease without esophagitis: Secondary | ICD-10-CM | POA: Diagnosis present

## 2015-06-01 DIAGNOSIS — K449 Diaphragmatic hernia without obstruction or gangrene: Secondary | ICD-10-CM | POA: Diagnosis not present

## 2015-06-15 LAB — HM COLONOSCOPY

## 2015-11-04 ENCOUNTER — Other Ambulatory Visit: Payer: Self-pay | Admitting: Family Medicine

## 2015-11-04 DIAGNOSIS — I10 Essential (primary) hypertension: Secondary | ICD-10-CM

## 2016-01-04 ENCOUNTER — Other Ambulatory Visit: Payer: Self-pay | Admitting: Nurse Practitioner

## 2016-01-04 DIAGNOSIS — G8929 Other chronic pain: Secondary | ICD-10-CM

## 2016-01-04 DIAGNOSIS — R1011 Right upper quadrant pain: Secondary | ICD-10-CM

## 2016-01-04 DIAGNOSIS — K760 Fatty (change of) liver, not elsewhere classified: Secondary | ICD-10-CM | POA: Insufficient documentation

## 2016-01-04 DIAGNOSIS — R109 Unspecified abdominal pain: Secondary | ICD-10-CM

## 2016-01-07 ENCOUNTER — Ambulatory Visit: Payer: 59

## 2016-01-13 ENCOUNTER — Ambulatory Visit
Admission: RE | Admit: 2016-01-13 | Discharge: 2016-01-13 | Disposition: A | Discharge status: Home / Self Care | Payer: Managed Care, Other (non HMO) | Source: Ambulatory Visit | Attending: Nurse Practitioner | Admitting: Nurse Practitioner

## 2016-01-13 DIAGNOSIS — R109 Unspecified abdominal pain: Secondary | ICD-10-CM

## 2016-01-13 DIAGNOSIS — R93422 Abnormal radiologic findings on diagnostic imaging of left kidney: Secondary | ICD-10-CM | POA: Diagnosis not present

## 2016-01-13 DIAGNOSIS — R1011 Right upper quadrant pain: Secondary | ICD-10-CM | POA: Diagnosis not present

## 2016-01-13 DIAGNOSIS — K760 Fatty (change of) liver, not elsewhere classified: Secondary | ICD-10-CM | POA: Diagnosis not present

## 2016-01-13 DIAGNOSIS — G8929 Other chronic pain: Secondary | ICD-10-CM | POA: Diagnosis not present

## 2016-02-07 ENCOUNTER — Ambulatory Visit (INDEPENDENT_AMBULATORY_CARE_PROVIDER_SITE_OTHER): Payer: Managed Care, Other (non HMO) | Admitting: Urology

## 2016-02-07 ENCOUNTER — Encounter: Payer: Self-pay | Admitting: Urology

## 2016-02-07 VITALS — BP 146/79 | HR 77 | Ht 60.5 in | Wt 134.0 lb

## 2016-02-07 DIAGNOSIS — R93429 Abnormal radiologic findings on diagnostic imaging of unspecified kidney: Secondary | ICD-10-CM

## 2016-02-07 LAB — URINALYSIS, COMPLETE
Bilirubin, UA: NEGATIVE
Glucose, UA: NEGATIVE
Ketones, UA: NEGATIVE
NITRITE UA: NEGATIVE
Protein, UA: NEGATIVE
Specific Gravity, UA: <1.005 — ABNORMAL LOW (ref 1.005–1.030)
Urobilinogen, Ur: 0.2 mg/dL (ref 0.2–1.0)
pH, UA: 5.5 (ref 5.0–7.5)

## 2016-02-07 LAB — MICROSCOPIC EXAMINATION

## 2016-02-07 NOTE — Progress Notes (Addendum)
.   02/07/2016 1:55 PM   Amanda Nelson 06-24-1950 [id number]  Referring provider: Tamsen Roers, PA [address] [place], [place] [address]  Chief Complaint  Patient presents with  . New Patient (Initial Visit)    Renal Mass    HPI: 65 yo WF referred for a left renal abnormality of u/s. She was evaluated by GI for RUQ pain and underwent an abd u/s 01/13/2016. This revealed the Left upper renal pole was "slightly heterogeneous". An MRI of kidneys was recommended to "exclude a a left upper renal pole mass". The patient has not had an MRI nor any other axial imaging recently.   She has no h/o recurrent UTI. No dysuria or gross hematuria. No GU Hx. No flank pain.   PMH: Past Medical History:  Diagnosis Date  . Anxiety   . Arthritis   . Hyperlipidemia   . Hypertension     Surgical History: Past Surgical History:  Procedure Laterality Date  . TUBAL LIGATION  1981    Home Medications:    Medication List       Accurate as of 02/07/16  1:55 PM. Always use your most recent med list.          albuterol 108 (90 Base) MCG/ACT inhaler Commonly known as:  PROVENTIL HFA;VENTOLIN HFA Inhale 2 puffs into the lungs every 6 (six) hours as needed for wheezing or shortness of breath.   lisinopril 5 MG tablet Commonly known as:  PRINIVIL,ZESTRIL take 1 tablet by mouth once daily   mometasone-formoterol 100-5 MCG/ACT Aero Commonly known as:  DULERA Inhale 2 puffs into the lungs 2 (two) times daily.   pravastatin 20 MG tablet Commonly known as:  PRAVACHOL Take 1 tablet (20 mg total) by mouth daily.   tiotropium 18 MCG inhalation capsule Commonly known as:  SPIRIVA Place 1 capsule (18 mcg total) into inhaler and inhale daily.       Allergies: No Known Allergies  Family History: Family History  Problem Relation Age of Onset  . Stroke Brother   . Prostate cancer Brother   . Kidney cancer Sister   . Cancer Sister     cancer    Social History:  reports  that she has been smoking Cigarettes.  She has a 30.00 pack-year smoking history. She has never used smokeless tobacco. She reports that she does not drink alcohol or use drugs.  ROS: UROLOGY Frequent Urination?: Yes Hard to postpone urination?: No Burning/pain with urination?: No Get up at night to urinate?: Yes Leakage of urine?: No Urine stream starts and stops?: No Trouble starting stream?: No Do you have to strain to urinate?: No Blood in urine?: No Urinary tract infection?: No Sexually transmitted disease?: No Injury to kidneys or bladder?: No Painful intercourse?: No Weak stream?: No Currently pregnant?: No Vaginal bleeding?: No Last menstrual period?: n  Gastrointestinal Nausea?: No Vomiting?: No Indigestion/heartburn?: Yes Diarrhea?: No Constipation?: No  Constitutional Fever: No Night sweats?: No Weight loss?: No Fatigue?: Yes  Skin Skin rash/lesions?: No Itching?: No  Eyes Blurred vision?: No Double vision?: No  Ears/Nose/Throat Sore throat?: No Sinus problems?: Yes  Hematologic/Lymphatic Swollen glands?: No Easy bruising?: No  Cardiovascular Leg swelling?: No Chest pain?: No  Respiratory Cough?: No Shortness of breath?: No  Endocrine Excessive thirst?: No  Musculoskeletal Back pain?: Yes Joint pain?: Yes  Neurological Headaches?: No Dizziness?: No  Psychologic Depression?: No Anxiety?: No  Physical Exam: BP (!) 146/79   Pulse 77   Ht 5' 0.5" (  1.537 m)   Wt 60.8 kg (134 lb)   BMI 25.74 kg/m   Constitutional:  Alert and oriented, No acute distress. HEENT: [place] AT, moist mucus membranes.  Trachea midline, no masses. Cardiovascular: No clubbing, cyanosis, or edema. Respiratory: Normal respiratory effort, no increased work of breathing. GI: Abdomen is soft, nontender, nondistended, no abdominal masses GU: No CVA tenderness Skin: No rashes, bruises or suspicious lesions. Neurologic: Grossly intact, no focal deficits, moving  all 4 extremities. Psychiatric: Normal mood and affect.   Laboratory Data: Lab Results  Component Value Date   WBC 6.4 05/18/2015   HCT 38.9 05/18/2015   MCV 93 05/18/2015   PLT 203 05/18/2015    Lab Results  Component Value Date   CREATININE 0.61 05/18/2015    No results found for: PSA  No results found for: TESTOSTERONE  No results found for: HGBA1C  Urinalysis No results found for: COLORURINE, APPEARANCEUR, LABSPEC, PHURINE, GLUCOSEU, HGBUR, BILIRUBINUR, KETONESUR, PROTEINUR, UROBILINOGEN, NITRITE, LEUKOCYTESUR  Pertinent Imaging: Abd u/s - images reviewed   Assessment & Plan:  1) abnormal findings of kidney on imaging (L24.401) - discussed with patient nature risks benefits and alternatives to renal MRI (with IV contrast) and she elects to proceed.   2) MH - after pt left her UA returned with >10 epithelial cells, 6-10 wbc's and 3-10 rbc's. She will need repeat UA and cystoscopy if remains +.   There are no diagnoses linked to this encounter.  No Follow-up on file.  Jerilee Field, MD  [facility] [address] [place], [place] [address] [phone]

## 2016-02-08 LAB — BUN+CREAT
BUN / CREAT RATIO: 17 (ref 12–28)
BUN: 11 mg/dL (ref 8–27)
CREATININE: 0.66 mg/dL (ref 0.57–1.00)
GFR calc Af Amer: 108 mL/min/{1.73_m2} (ref >59)
GFR calc non Af Amer: 94 mL/min/{1.73_m2} (ref >59)

## 2016-03-02 ENCOUNTER — Ambulatory Visit
Admission: RE | Admit: 2016-03-02 | Discharge: 2016-03-02 | Disposition: A | Discharge status: Home / Self Care | Payer: Managed Care, Other (non HMO) | Source: Ambulatory Visit | Attending: Urology | Admitting: Urology

## 2016-03-02 DIAGNOSIS — N281 Cyst of kidney, acquired: Secondary | ICD-10-CM | POA: Insufficient documentation

## 2016-03-02 DIAGNOSIS — R93429 Abnormal radiologic findings on diagnostic imaging of unspecified kidney: Secondary | ICD-10-CM

## 2016-03-02 DIAGNOSIS — K7689 Other specified diseases of liver: Secondary | ICD-10-CM | POA: Insufficient documentation

## 2016-03-02 MED ORDER — GADOBENATE DIMEGLUMINE 529 MG/ML IV SOLN
15.0000 mL | Freq: Once | INTRAVENOUS | Status: AC | PRN
  Administered 2016-03-02: 12 mL via INTRAVENOUS

## 2016-03-08 ENCOUNTER — Telehealth: Payer: Self-pay

## 2016-03-08 NOTE — Telephone Encounter (Signed)
Festus Aloe, MD  Lestine Box, LPN        Notify patient the MRI of her kidneys was normal. No worrisome findings - she did have a very small kidney cyst but these are not uncommon and they are benign. She needs to follow-up in office next 2-4 weeks for a repeat urinalysis (she had some blood on her last u/a).    LMOM

## 2016-03-08 NOTE — Telephone Encounter (Signed)
Spoke with pt in reference to MRI results and needing f/u appt. Pt voiced understanding.

## 2016-03-17 DIAGNOSIS — K219 Gastro-esophageal reflux disease without esophagitis: Secondary | ICD-10-CM | POA: Insufficient documentation

## 2016-03-20 ENCOUNTER — Ambulatory Visit: Payer: Managed Care, Other (non HMO)

## 2016-08-03 ENCOUNTER — Other Ambulatory Visit: Payer: Self-pay | Admitting: Family Medicine

## 2016-08-03 DIAGNOSIS — I10 Essential (primary) hypertension: Secondary | ICD-10-CM

## 2016-08-03 NOTE — Telephone Encounter (Signed)
See refill request.

## 2016-08-22 ENCOUNTER — Other Ambulatory Visit: Payer: Self-pay

## 2016-08-24 ENCOUNTER — Ambulatory Visit (INDEPENDENT_AMBULATORY_CARE_PROVIDER_SITE_OTHER): Payer: Medicare HMO | Admitting: Family Medicine

## 2016-08-24 VITALS — BP 140/80 | HR 64 | Temp 97.9°F | Resp 16 | Ht 60.0 in | Wt 131.0 lb

## 2016-08-24 DIAGNOSIS — Z0000 Encounter for general adult medical examination without abnormal findings: Secondary | ICD-10-CM

## 2016-08-24 DIAGNOSIS — I10 Essential (primary) hypertension: Secondary | ICD-10-CM

## 2016-08-24 MED ORDER — LISINOPRIL 5 MG PO TABS
5.0000 mg | ORAL_TABLET | Freq: Every day | ORAL | 3 refills | Status: DC
Start: 2016-08-24 — End: 2017-09-06

## 2016-08-24 NOTE — Progress Notes (Signed)
Patient: Amanda Nelson, Female    DOB: 09/27/1950, 66 y.o.   MRN: [id number] Visit Date: 08/24/2016  Today's Provider: Dortha Kern, PA   Chief Complaint  Patient presents with  . Annual Exam   Subjective:   Amanda Nelson is a 66 y.o. female who presents today for her Subsequent Annual Wellness Visit. She feels well. She reports exercising none. She reports she is sleeping well.  Patient states she had last colonoscopy 1 year ago with Dr Markham Jordan.  No record in our file.   Review of Systems  Constitutional: Negative.   HENT: Positive for postnasal drip, rhinorrhea and sinus pain.   Respiratory: Negative.   Cardiovascular: Negative.   Gastrointestinal: Negative.   Endocrine: Negative.   Genitourinary: Negative.   Musculoskeletal: Positive for arthralgias and back pain.  Skin: Negative.   Allergic/Immunologic: Negative.   Neurological: Negative.   Psychiatric/Behavioral: Negative.     Patient Active Problem List   Diagnosis Date Noted  . GERD without esophagitis 03/17/2016  . Fatty infiltration of liver 01/04/2016  . Chronic RUQ pain 01/04/2016  . COPD (chronic obstructive pulmonary disease) (HCC) 01/12/2015  . Chronic sinusitis 12/29/2014  . Dysphagia 12/29/2014  . Esophagitis, reflux 12/29/2014  . Tobacco use 12/29/2014  . Anxiety disorder 12/29/2014  . Essential hypertension 12/29/2014  . Pure hypercholesterolemia 12/29/2014  . Need for prophylactic hormone replacement therapy (postmenopausal) 04/03/2006    Social History   Social History  . Marital status: Married    Spouse name: N/A  . Number of children: N/A  . Years of education: N/A   Occupational History  . Not on file.   Social History Main Topics  . Smoking status: Current Every Day Smoker    Packs/day: 1.00    Years: 30.00    Types: Cigarettes  . Smokeless tobacco: Never Used  . Alcohol use No  . Drug use: No  . Sexual activity: Not on file   Other Topics Concern  . Not on  file   Social History Narrative  . No narrative on file    Past Surgical History:  Procedure Laterality Date  . TUBAL LIGATION  1981    Her family history includes Cancer in her sister; Kidney cancer in her sister; Prostate cancer in her brother; Stroke in her brother.     Outpatient Encounter Prescriptions as of 08/24/2016  Medication Sig Note  . albuterol (PROVENTIL HFA;VENTOLIN HFA) 108 (90 BASE) MCG/ACT inhaler Inhale 2 puffs into the lungs every 6 (six) hours as needed for wheezing or shortness of breath. 01/12/2015: Taking PRN  . lisinopril (PRINIVIL,ZESTRIL) 5 MG tablet take 1 tablet by mouth once daily   . mometasone-formoterol (DULERA) 100-5 MCG/ACT AERO Inhale 2 puffs into the lungs 2 (two) times daily.   Marland Kitchen omeprazole (PRILOSEC) 40 MG capsule Take by mouth.   . tiotropium (SPIRIVA) 18 MCG inhalation capsule Place 1 capsule (18 mcg total) into inhaler and inhale daily.   . [DISCONTINUED] pravastatin (PRAVACHOL) 20 MG tablet Take 1 tablet (20 mg total) by mouth daily.    No facility-administered encounter medications on file as of 08/24/2016.     No Known Allergies  Patient Care Team: Michella Detjen, Jodell Cipro, PA as PCP - General (Family Medicine)   Objective:   Vitals:  Vitals:   08/24/16 0919  BP: 140/80  Pulse: 64  Resp: 16  Temp: 97.9 F (36.6 C)  TempSrc: Oral  Weight: 131 lb (59.4 kg)  Height: 5' (1.524 m)    Physical  Exam  Constitutional: She is oriented to person, place, and time. She appears well-developed and well-nourished.  HENT:  Head: Normocephalic and atraumatic.  Right Ear: External ear normal.  Left Ear: External ear normal.  Nose: Nose normal.  Mouth/Throat: Oropharynx is clear and moist.  Eyes: Conjunctivae and EOM are normal. Pupils are equal, round, and reactive to light.  Neck: Normal range of motion. Neck supple.  Cardiovascular: Normal rate, regular rhythm, normal heart sounds and intact distal pulses.   Pulmonary/Chest: Effort normal and  breath sounds normal.  Abdominal: Soft. Bowel sounds are normal.  Genitourinary:  Genitourinary Comments: Deferred to GYN physical.  Musculoskeletal: Normal range of motion.  Slight crepitus in both knees with good ROM and no swelling.  Neurological: She is alert and oriented to person, place, and time. She has normal reflexes.  Skin: Skin is warm and dry.  Psychiatric: She has a normal mood and affect. Her behavior is normal. Judgment and thought content normal.   Current Exercise Habits: The patient does not participate in regular exercise at present   Activities of Daily Living In your present state of health, do you have any difficulty performing the following activities: 08/24/2016  Hearing? N  Vision? N  Difficulty concentrating or making decisions? N  Walking or climbing stairs? N  Dressing or bathing? N  Doing errands, shopping? N  Some recent data might be hidden    Fall Risk Assessment Fall Risk  08/24/2016  Falls in the past year? No     Depression Screen PHQ 2/9 Scores 08/24/2016  PHQ - 2 Score 0   Cognitive Testing - 6-CIT    Year: 0 4 points  Month: 0 3 points  Memorize "Floyde Parkins, [address] [place]"  Time (within 1 hour:) 0 3 points  Count backwards from 20: 0 2 4 points  Name months of year: 0 2 4 points  Repeat Address: 0 2 4 6 8 10  points   Total Score: 10/28  Interpretation : Normal (0-7) Abnormal (8-28)    Assessment & Plan:     Annual Wellness Visit  Reviewed patient's Family Medical History Reviewed and updated list of patient's medical providers Assessment of cognitive impairment was done Assessed patient's functional ability Established a written schedule for health screening services Health Risk Assessent Completed and Reviewed  Exercise Activities and Dietary recommendations Goals    Recommend 30 minute exercise 3-4 days a week.       Health Maintenance  Topic Date Due  . Hepatitis C Screening  02/11/1951  . HIV  Screening  07/28/1966  . TETANUS/TDAP  07/28/1970  . PAP SMEAR  07/27/1972  . MAMMOGRAM  07/27/2001  . COLONOSCOPY  07/27/2001  . DEXA SCAN  07/27/2016  . PNA vac Low Risk Adult (1 of 2 - PCV13) 07/27/2016  . INFLUENZA VACCINE  10/11/2016     Discussed health benefits of physical activity, and encouraged her to engage in regular exercise appropriate for her age and condition.    1. Encounter for Medicare annual wellness exam General health good. Dr. Shelle Iron (gastroenterologist) ordered colonoscopy on 06-15-15 - will requests records. EKG essentially normal with short PR. Still smoking <1ppd. Encouraged to stop all smoking. Screenings as above documentation. - EKG 12-Lead  2. Essential hypertension BP fairly stable. Will refill Lisinopril 5 mg qd. Recheck for follow up in a week or two. - lisinopril (PRINIVIL,ZESTRIL) 5 MG tablet; Take 1 tablet (5 mg total) by mouth daily.  Dispense: 90 tablet; Refill: 3

## 2016-09-05 ENCOUNTER — Ambulatory Visit (INDEPENDENT_AMBULATORY_CARE_PROVIDER_SITE_OTHER): Payer: Medicare HMO | Admitting: Family Medicine

## 2016-09-05 ENCOUNTER — Encounter: Payer: Self-pay | Admitting: Family Medicine

## 2016-09-05 VITALS — BP 130/78 | HR 60 | Temp 98.0°F | Ht 60.0 in | Wt 132.2 lb

## 2016-09-05 DIAGNOSIS — Z23 Encounter for immunization: Secondary | ICD-10-CM | POA: Diagnosis not present

## 2016-09-05 DIAGNOSIS — Z114 Encounter for screening for human immunodeficiency virus [HIV]: Secondary | ICD-10-CM | POA: Diagnosis not present

## 2016-09-05 DIAGNOSIS — Z1231 Encounter for screening mammogram for malignant neoplasm of breast: Secondary | ICD-10-CM | POA: Diagnosis not present

## 2016-09-05 DIAGNOSIS — E7800 Pure hypercholesterolemia, unspecified: Secondary | ICD-10-CM | POA: Diagnosis not present

## 2016-09-05 DIAGNOSIS — Z9189 Other specified personal risk factors, not elsewhere classified: Secondary | ICD-10-CM

## 2016-09-05 DIAGNOSIS — K219 Gastro-esophageal reflux disease without esophagitis: Secondary | ICD-10-CM | POA: Diagnosis not present

## 2016-09-05 DIAGNOSIS — F418 Other specified anxiety disorders: Secondary | ICD-10-CM | POA: Diagnosis not present

## 2016-09-05 DIAGNOSIS — Z720 Tobacco use: Secondary | ICD-10-CM | POA: Diagnosis not present

## 2016-09-05 DIAGNOSIS — I10 Essential (primary) hypertension: Secondary | ICD-10-CM

## 2016-09-05 DIAGNOSIS — Z1159 Encounter for screening for other viral diseases: Secondary | ICD-10-CM

## 2016-09-05 MED ORDER — OMEPRAZOLE 40 MG PO CPDR
40.0000 mg | DELAYED_RELEASE_CAPSULE | Freq: Every day | ORAL | 3 refills | Status: DC
Start: 2016-09-05 — End: 2017-09-06

## 2016-09-05 NOTE — Progress Notes (Signed)
Patient: Amanda Nelson, Female    DOB: [birth year], 66 y.o.   MRN: [id number] Visit Date: 09/05/2016  Today's Provider: [name], PA   Chief Complaint  Patient presents with  . Annual GYN Exam   Subjective:    Annual physical exam Amanda Nelson is a 66 y.o. female who presents today for health maintenance and complete physical. She feels well. She reports exercising none. She reports she is sleeping well. Denies hot flashes, night sweats or vaginal discharge/periods for years. No HRT. No breast masses felt at home.  -----------------------------------------------------------------   Review of Systems  Constitutional: Negative.   HENT: Positive for hearing loss and sinus pressure.   Eyes: Negative.   Respiratory: Negative.   Cardiovascular: Negative.   Gastrointestinal: Negative.   Endocrine: Negative.   Genitourinary: Negative.   Musculoskeletal: Positive for arthralgias.  Skin: Negative.   Allergic/Immunologic: Negative.   Neurological: Negative.   Hematological: Negative.   Psychiatric/Behavioral: Negative.     Social History      She  reports that she has been smoking Cigarettes.  She has a 30.00 pack-year smoking history. She has never used smokeless tobacco. She reports that she does not drink alcohol or use drugs.       Social History   Social History  . Marital status: Married    Spouse name: N/A  . Number of children: N/A  . Years of education: N/A   Social History Main Topics  . Smoking status: Current Every Day Smoker    Packs/day: 1.00    Years: 30.00    Types: Cigarettes  . Smokeless tobacco: Never Used  . Alcohol use No  . Drug use: No  . Sexual activity: Not Asked   Other Topics Concern  . None   Social History Narrative  . None    Past Medical History:  Diagnosis Date  . Anxiety   . Arthritis   . Hyperlipidemia   . Hypertension      Patient Active Problem List   Diagnosis Date Noted  . GERD without  esophagitis 03/17/2016  . Fatty infiltration of liver 01/04/2016  . Chronic RUQ pain 01/04/2016  . COPD (chronic obstructive pulmonary disease) ([place]) 01/12/2015  . Chronic sinusitis 12/29/2014  . Dysphagia 12/29/2014  . Esophagitis, reflux 12/29/2014  . Tobacco use 12/29/2014  . Anxiety disorder 12/29/2014  . Essential hypertension 12/29/2014  . Pure hypercholesterolemia 12/29/2014  . Need for prophylactic hormone replacement therapy (postmenopausal) 04/03/2006    Past Surgical History:  Procedure Laterality Date  . TUBAL LIGATION  1981    Family History        Family Status  Relation Status  . Mother Deceased at age 69       cause of death was child birth  . Father Deceased at age 8's       died from an MI  . Sister Alive  . Brother Alive  . Sister Alive  . Sister Alive  . Sister Deceased  . Brother Alive  . Brother Deceased        Her family history includes Cancer in her sister; Kidney cancer in her sister; Prostate cancer in her brother; Stroke in her brother.     No Known Allergies   Current Outpatient Prescriptions:  .  albuterol (PROVENTIL HFA;VENTOLIN HFA) 108 (90 BASE) MCG/ACT inhaler, Inhale 2 puffs into the lungs every 6 (six) hours as needed for wheezing or shortness of breath., Disp: 1 Inhaler, Rfl: 3 .  Patient: Amanda Nelson, Female    DOB: [birth year], 66 y.o.   MRN: [id number] Visit Date: 09/05/2016  Today's Provider: [name], PA   Chief Complaint  Patient presents with  . Annual GYN Exam   Subjective:    Annual physical exam Amanda Nelson is a 66 y.o. female who presents today for health maintenance and complete physical. She feels well. She reports exercising none. She reports she is sleeping well. Denies hot flashes, night sweats or vaginal discharge/periods for years. No HRT. No breast masses felt at home.  -----------------------------------------------------------------   Review of Systems  Constitutional: Negative.   HENT: Positive for hearing loss and sinus pressure.   Eyes: Negative.   Respiratory: Negative.   Cardiovascular: Negative.   Gastrointestinal: Negative.   Endocrine: Negative.   Genitourinary: Negative.   Musculoskeletal: Positive for arthralgias.  Skin: Negative.   Allergic/Immunologic: Negative.   Neurological: Negative.   Hematological: Negative.   Psychiatric/Behavioral: Negative.     Social History      She  reports that she has been smoking Cigarettes.  She has a 30.00 pack-year smoking history. She has never used smokeless tobacco. She reports that she does not drink alcohol or use drugs.       Social History   Social History  . Marital status: Married    Spouse name: N/A  . Number of children: N/A  . Years of education: N/A   Social History Main Topics  . Smoking status: Current Every Day Smoker    Packs/day: 1.00    Years: 30.00    Types: Cigarettes  . Smokeless tobacco: Never Used  . Alcohol use No  . Drug use: No  . Sexual activity: Not Asked   Other Topics Concern  . None   Social History Narrative  . None    Past Medical History:  Diagnosis Date  . Anxiety   . Arthritis   . Hyperlipidemia   . Hypertension      Patient Active Problem List   Diagnosis Date Noted  . GERD without  esophagitis 03/17/2016  . Fatty infiltration of liver 01/04/2016  . Chronic RUQ pain 01/04/2016  . COPD (chronic obstructive pulmonary disease) ([place]) 01/12/2015  . Chronic sinusitis 12/29/2014  . Dysphagia 12/29/2014  . Esophagitis, reflux 12/29/2014  . Tobacco use 12/29/2014  . Anxiety disorder 12/29/2014  . Essential hypertension 12/29/2014  . Pure hypercholesterolemia 12/29/2014  . Need for prophylactic hormone replacement therapy (postmenopausal) 04/03/2006    Past Surgical History:  Procedure Laterality Date  . TUBAL LIGATION  1981    Family History        Family Status  Relation Status  . Mother Deceased at age 69       cause of death was child birth  . Father Deceased at age 8's       died from an MI  . Sister Alive  . Brother Alive  . Sister Alive  . Sister Alive  . Sister Deceased  . Brother Alive  . Brother Deceased        Her family history includes Cancer in her sister; Kidney cancer in her sister; Prostate cancer in her brother; Stroke in her brother.     No Known Allergies   Current Outpatient Prescriptions:  .  albuterol (PROVENTIL HFA;VENTOLIN HFA) 108 (90 BASE) MCG/ACT inhaler, Inhale 2 puffs into the lungs every 6 (six) hours as needed for wheezing or shortness of breath., Disp: 1 Inhaler, Rfl: 3 .  lisinopril (PRINIVIL,ZESTRIL) 5 MG tablet, Take 1 tablet (5 mg total) by mouth daily., Disp: 90 tablet, Rfl: 3 .  mometasone-formoterol (DULERA) 100-5 MCG/ACT AERO, Inhale 2 puffs into the lungs 2 (two) times daily., Disp: 8.8 g, Rfl: 0 .  omeprazole (PRILOSEC) 40 MG capsule, Take by mouth., Disp: , Rfl:  .  tiotropium (SPIRIVA) 18 MCG inhalation capsule, Place 1 capsule (18 mcg total) into inhaler and inhale daily., Disp: 30 capsule, Rfl: 12   Patient Care Team: Chrismon, Vickki Muff, PA as PCP - General (Family Medicine)      Objective:   Vitals: BP 130/78 (BP Location: Right Arm, Patient Position: Sitting, Cuff Size: Normal)   Pulse 60   Temp 98 F  (36.7 C) (Oral)   Ht 5' (1.524 m)   Wt 132 lb 3.2 oz (60 kg)   SpO2 99%   BMI 25.82 kg/m    Vitals:   09/05/16 0903  BP: 130/78  Pulse: 60  Temp: 98 F (36.7 C)  TempSrc: Oral  SpO2: 99%  Weight: 132 lb 3.2 oz (60 kg)  Height: 5' (1.524 m)     Physical Exam  Constitutional: She is oriented to person, place, and time. She appears well-developed and well-nourished.  HENT:  Head: Normocephalic and atraumatic.  Right Ear: External ear normal.  Left Ear: External ear normal.  Nose: Nose normal.  Mouth/Throat: Oropharynx is clear and moist.  Edentulous - compensated. Hearing loss right ear.  Eyes: Conjunctivae and EOM are normal. Pupils are equal, round, and reactive to light. Right eye exhibits no discharge.  Neck: Normal range of motion. Neck supple. No tracheal deviation present. No thyromegaly present.  Cardiovascular: Normal rate, regular rhythm, normal heart sounds and intact distal pulses.   No murmur heard. Pulmonary/Chest: Effort normal and breath sounds normal. No respiratory distress. She has no wheezes. She has no rales. She exhibits no tenderness.  Abdominal: Soft. Bowel sounds are normal. She exhibits no distension and no mass. There is no tenderness. There is no rebound and no guarding.  Musculoskeletal: Normal range of motion. She exhibits no edema or tenderness.  Slight crepitus behind both patellae (L>R).  Lymphadenopathy:    She has no cervical adenopathy.  Neurological: She is alert and oriented to person, place, and time. She has normal reflexes. No cranial nerve deficit. She exhibits normal muscle tone. Coordination normal.  Skin: Skin is warm and dry. No rash noted. No erythema.  Psychiatric: She has a normal mood and affect. Her behavior is normal. Judgment and thought content normal.   Depression Screen PHQ 2/9 Scores 08/24/2016  PHQ - 2 Score 0    Assessment & Plan:     Routine Health Maintenance and Physical Exam  Exercise Activities and  Dietary recommendations Goals    Recommend 30 minutes exercise program 3-4 days a week.      Health Maintenance  Topic Date Due  . Hepatitis C Screening  August 25, 1950  . HIV Screening  07/28/1966  . TETANUS/TDAP  07/28/1970  . MAMMOGRAM  07/27/2001  . PAP SMEAR  05/29/2011  . PNA vac Low Risk Adult (1 of 2 - PCV13) 07/27/2016  . INFLUENZA VACCINE  10/11/2016  . COLONOSCOPY  06/14/2025  . DEXA SCAN  Completed     Discussed health benefits of physical activity, and encouraged her to engage in regular exercise appropriate for her age and condition.    -------------------------------------------------------------------- 1. GYN exam for high-risk Medicare patient No menses or vaginal discharge. Denies significant hot flashes.

## 2016-09-06 LAB — COMPREHENSIVE METABOLIC PANEL
A/G RATIO: 2 (ref 1.2–2.2)
ALK PHOS: 66 IU/L (ref 39–117)
ALT: 10 IU/L (ref 0–32)
AST: 17 IU/L (ref 0–40)
Albumin: 4.5 g/dL (ref 3.6–4.8)
BUN/Creatinine Ratio: 12 (ref 12–28)
BUN: 9 mg/dL (ref 8–27)
Bilirubin Total: 0.4 mg/dL (ref 0.0–1.2)
CALCIUM: 9.4 mg/dL (ref 8.7–10.3)
CO2: 25 mmol/L (ref 20–29)
Chloride: 104 mmol/L (ref 96–106)
Creatinine, Ser: 0.73 mg/dL (ref 0.57–1.00)
GFR calc Af Amer: 100 mL/min/{1.73_m2} (ref >59)
GFR, EST NON AFRICAN AMERICAN: 87 mL/min/{1.73_m2} (ref >59)
GLOBULIN, TOTAL: 2.3 g/dL (ref 1.5–4.5)
Glucose: 88 mg/dL (ref 65–99)
POTASSIUM: 4.2 mmol/L (ref 3.5–5.2)
SODIUM: 141 mmol/L (ref 134–144)
Total Protein: 6.8 g/dL (ref 6.0–8.5)

## 2016-09-06 LAB — CBC WITH DIFFERENTIAL/PLATELET
BASOS: 1 %
Basophils Absolute: 0.1 10*3/uL (ref 0.0–0.2)
EOS (ABSOLUTE): 0.1 10*3/uL (ref 0.0–0.4)
EOS: 2 %
HEMATOCRIT: 42.3 % (ref 34.0–46.6)
Hemoglobin: 14.2 g/dL (ref 11.1–15.9)
Immature Grans (Abs): 0 10*3/uL (ref 0.0–0.1)
Immature Granulocytes: 0 %
LYMPHS ABS: 2.3 10*3/uL (ref 0.7–3.1)
Lymphs: 37 %
MCH: 31.3 pg (ref 26.6–33.0)
MCHC: 33.6 g/dL (ref 31.5–35.7)
MCV: 93 fL (ref 79–97)
MONOS ABS: 0.5 10*3/uL (ref 0.1–0.9)
Monocytes: 8 %
Neutrophils Absolute: 3.3 10*3/uL (ref 1.4–7.0)
Neutrophils: 52 %
Platelets: 187 10*3/uL (ref 150–379)
RBC: 4.53 x10E6/uL (ref 3.77–5.28)
RDW: 14.6 % (ref 12.3–15.4)
WBC: 6.3 10*3/uL (ref 3.4–10.8)

## 2016-09-06 LAB — LIPID PANEL
CHOL/HDL RATIO: 3.8 ratio (ref 0.0–4.4)
Cholesterol, Total: 282 mg/dL — ABNORMAL HIGH (ref 100–199)
HDL: 75 mg/dL (ref >39)
LDL Calculated: 186 mg/dL — ABNORMAL HIGH (ref 0–99)
TRIGLYCERIDES: 107 mg/dL (ref 0–149)
VLDL Cholesterol Cal: 21 mg/dL (ref 5–40)

## 2016-09-06 LAB — TSH: TSH: 1.41 u[IU]/mL (ref 0.450–4.500)

## 2016-09-06 LAB — HEPATITIS C ANTIBODY: Hep C Virus Ab: 0.1 s/co ratio (ref 0.0–0.9)

## 2016-09-06 LAB — HIV ANTIBODY (ROUTINE TESTING W REFLEX): HIV Screen 4th Generation wRfx: NONREACTIVE

## 2016-09-07 LAB — PAP IG (IMAGE GUIDED): PAP Smear Comment: 0

## 2016-09-18 ENCOUNTER — Encounter: Payer: Self-pay | Admitting: Family Medicine

## 2016-12-28 ENCOUNTER — Telehealth: Payer: Self-pay | Admitting: Family Medicine

## 2016-12-28 NOTE — Telephone Encounter (Signed)
ROI ([facility]) faxed to Dr. Elliott/Dr. Loraine Maple for records.  Received records from Dr. Cipriano Bunker. Rayann Heman, forward  5 pages to [facility]

## 2017-07-10 DIAGNOSIS — J019 Acute sinusitis, unspecified: Secondary | ICD-10-CM | POA: Diagnosis not present

## 2017-07-10 DIAGNOSIS — Z8709 Personal history of other diseases of the respiratory system: Secondary | ICD-10-CM | POA: Diagnosis not present

## 2017-07-10 DIAGNOSIS — J40 Bronchitis, not specified as acute or chronic: Secondary | ICD-10-CM | POA: Diagnosis not present

## 2017-07-16 DIAGNOSIS — R062 Wheezing: Secondary | ICD-10-CM | POA: Diagnosis not present

## 2017-07-16 DIAGNOSIS — R05 Cough: Secondary | ICD-10-CM | POA: Diagnosis not present

## 2017-07-16 DIAGNOSIS — J40 Bronchitis, not specified as acute or chronic: Secondary | ICD-10-CM | POA: Diagnosis not present

## 2017-07-16 DIAGNOSIS — Z8709 Personal history of other diseases of the respiratory system: Secondary | ICD-10-CM | POA: Diagnosis not present

## 2017-09-06 ENCOUNTER — Ambulatory Visit (INDEPENDENT_AMBULATORY_CARE_PROVIDER_SITE_OTHER): Payer: Medicare HMO

## 2017-09-06 ENCOUNTER — Ambulatory Visit (INDEPENDENT_AMBULATORY_CARE_PROVIDER_SITE_OTHER): Payer: Medicare HMO | Admitting: Family Medicine

## 2017-09-06 ENCOUNTER — Encounter: Payer: Self-pay | Admitting: Family Medicine

## 2017-09-06 VITALS — BP 146/76 | HR 66 | Temp 98.6°F | Ht 60.0 in | Wt 135.4 lb

## 2017-09-06 VITALS — BP 144/72 | HR 66 | Temp 98.6°F | Ht 60.0 in | Wt 135.4 lb

## 2017-09-06 DIAGNOSIS — K219 Gastro-esophageal reflux disease without esophagitis: Secondary | ICD-10-CM | POA: Diagnosis not present

## 2017-09-06 DIAGNOSIS — J449 Chronic obstructive pulmonary disease, unspecified: Secondary | ICD-10-CM | POA: Diagnosis not present

## 2017-09-06 DIAGNOSIS — Z1231 Encounter for screening mammogram for malignant neoplasm of breast: Secondary | ICD-10-CM

## 2017-09-06 DIAGNOSIS — E7800 Pure hypercholesterolemia, unspecified: Secondary | ICD-10-CM

## 2017-09-06 DIAGNOSIS — I10 Essential (primary) hypertension: Secondary | ICD-10-CM | POA: Diagnosis not present

## 2017-09-06 DIAGNOSIS — Z0000 Encounter for general adult medical examination without abnormal findings: Secondary | ICD-10-CM | POA: Diagnosis not present

## 2017-09-06 DIAGNOSIS — Z1239 Encounter for other screening for malignant neoplasm of breast: Secondary | ICD-10-CM

## 2017-09-06 DIAGNOSIS — K760 Fatty (change of) liver, not elsewhere classified: Secondary | ICD-10-CM

## 2017-09-06 MED ORDER — TIOTROPIUM BROMIDE MONOHYDRATE 18 MCG IN CAPS
18.0000 ug | ORAL_CAPSULE | Freq: Every day | RESPIRATORY_TRACT | 4 refills | Status: DC
Start: 2017-09-06 — End: 2021-04-12

## 2017-09-06 MED ORDER — LISINOPRIL 5 MG PO TABS: 5.0000 mg | ORAL_TABLET | Freq: Every day | ORAL | 4 refills | Status: DC

## 2017-09-06 MED ORDER — OMEPRAZOLE 40 MG PO CPDR: 40.0000 mg | DELAYED_RELEASE_CAPSULE | Freq: Every day | ORAL | 4 refills | Status: DC

## 2017-09-06 NOTE — Progress Notes (Addendum)
Subjective:   Amanda Nelson is a 67 y.o. female who presents for an Initial Medicare Annual Wellness Visit.  Review of Systems    N/A  Cardiac Risk Factors include: advanced age (>84men, >83 women);hypertension;smoking/ tobacco exposure     Objective:    Today's Vitals   09/06/17 0925 09/06/17 0946  BP: (!) 144/72 (!) 146/76  Pulse: 66   Temp: 98.6 F (37 C)   TempSrc: Oral   Weight: 135 lb 6.4 oz (61.4 kg)   Height: 5' (1.524 m)   PainSc: 0-No pain    Body mass index is 26.44 kg/m.  Advanced Directives 09/06/2017 08/24/2016  Does Patient Have a Medical Advance Directive? No No  Would patient like information on creating a medical advance directive? No - Patient declined -    Current Medications (verified) Outpatient Encounter Medications as of 09/06/2017  Medication Sig  . lisinopril (PRINIVIL,ZESTRIL) 5 MG tablet Take 1 tablet (5 mg total) by mouth daily.  . naproxen sodium (ALEVE) 220 MG tablet Take 220 mg by mouth as needed.  Marland Kitchen omeprazole (PRILOSEC) 40 MG capsule Take 1 capsule (40 mg total) by mouth daily.  Marland Kitchen albuterol (PROVENTIL HFA;VENTOLIN HFA) 108 (90 BASE) MCG/ACT inhaler Inhale 2 puffs into the lungs every 6 (six) hours as needed for wheezing or shortness of breath. (Patient not taking: Reported on 09/06/2017)  . mometasone-formoterol (DULERA) 100-5 MCG/ACT AERO Inhale 2 puffs into the lungs 2 (two) times daily. (Patient not taking: Reported on 09/06/2017)  . tiotropium (SPIRIVA) 18 MCG inhalation capsule Place 1 capsule (18 mcg total) into inhaler and inhale daily. (Patient not taking: Reported on 09/06/2017)   No facility-administered encounter medications on file as of 09/06/2017.     Allergies (verified) Patient has no known allergies.   History: Past Medical History:  Diagnosis Date  . Anxiety   . Arthritis   . Hyperlipidemia   . Hypertension    Past Surgical History:  Procedure Laterality Date  . TUBAL LIGATION  1981   Family History    Problem Relation Age of Onset  . Stroke Brother   . Prostate cancer Brother   . Kidney cancer Sister   . Cancer Sister        cancer   Social History   Socioeconomic History  . Marital status: Married    Spouse name: Not on file  . Number of children: 2  . Years of education: Not on file  . Highest education level: 12th grade  Occupational History  . Occupation: retired  Scientific laboratory technician  . Financial resource strain: Not hard at all  . Food insecurity:    Worry: Never true    Inability: Never true  . Transportation needs:    Medical: No    Non-medical: No  Tobacco Use  . Smoking status: Current Every Day Smoker    Packs/day: 0.50    Years: 30.00    Pack years: 15.00    Types: Cigarettes  . Smokeless tobacco: Never Used  . Tobacco comment: 1/2 and 3/4 pack a day   Substance and Sexual Activity  . Alcohol use: No    Alcohol/week: 0.0 oz  . Drug use: No  . Sexual activity: Not on file  Lifestyle  . Physical activity:    Days per week: Not on file    Minutes per session: Not on file  . Stress: Not at all  Relationships  . Social connections:    Talks on phone: Not on file  Gets together: Not on file    Attends religious service: Not on file    Active member of club or organization: Not on file    Attends meetings of clubs or organizations: Not on file    Relationship status: Not on file  Other Topics Concern  . Not on file  Social History Narrative  . Not on file    Tobacco Counseling Ready to quit: No Counseling given: No Comment: 1/2 and 3/4 pack a day    Clinical Intake:  Pre-visit preparation completed: Yes  Pain : No/denies pain Pain Score: 0-No pain     Nutritional Status: BMI 25 -29 Overweight Nutritional Risks: None Diabetes: No  How often do you need to have someone help you when you read instructions, pamphlets, or other written materials from your doctor or pharmacy?: 1 - Never  Interpreter Needed?: No  Information entered by ::  [facility], LPN   Activities of Daily Living In your present state of health, do you have any difficulty performing the following activities: 09/06/2017  Hearing? Y  Comment Trouble hearing out of right ear. Pt interested in seeing an audiologist.  Vision? N  Difficulty concentrating or making decisions? N  Walking or climbing stairs? N  Dressing or bathing? N  Doing errands, shopping? N  Preparing Food and eating ? N  Using the Toilet? N  In the past six months, have you accidently leaked urine? Y  Comment Occasionally when pressure.  Do you have problems with loss of bowel control? N  Managing your Medications? N  Managing your Finances? N  Housekeeping or managing your Housekeeping? N  Some recent data might be hidden     Immunizations and Health Maintenance Immunization History  Administered Date(s) Administered  . Pneumococcal Conjugate-13 09/05/2016  . Tdap 09/05/2016   Health Maintenance Due  Topic Date Due  . MAMMOGRAM  07/27/2001  . PNA vac Low Risk Adult (2 of 2 - PPSV23) 09/05/2017    Patient Care Team: Chrismon, Vickki Muff, PA as PCP - General (Family Medicine) Manya Silvas, MD as Consulting Physician (Gastroenterology)  Indicate any recent Medical Services you may have received from other than [facility] providers in the past year (date may be approximate).     Assessment:   This is a routine wellness examination for [place].  Hearing/Vision screen Vision Screening Comments: Pt does not have vision checks yearly. Unsure when her last eye exam was. No current issues.   Dietary issues and exercise activities discussed: Current Exercise Habits: The patient does not participate in regular exercise at present, Exercise limited by: None identified  Goals    . DIET - INCREASE WATER INTAKE     Recommend increasing water intake to 4-6 glasses a day.       Depression Screen PHQ 2/9 Scores 09/06/2017 09/06/2017 08/24/2016  PHQ - 2 Score 0 0 0  PHQ- 9 Score 0 -  -    Fall Risk Fall Risk  09/06/2017 08/24/2016  Falls in the past year? No No    Is the patient's home free of loose throw rugs in walkways, pet beds, electrical cords, etc?   yes      Grab bars in the bathroom? no      Handrails on the stairs?   no      Adequate lighting?   yes  Timed Get Up and Go Performed N/A  Cognitive Function: Pt declined screening today.     6CIT Screen 08/24/2016  What Year? 0  points  What month? 0 points  What time? 0 points  Count back from 20 0 points  Months in reverse 0 points  Repeat phrase 10 points  Total Score 10    Screening Tests Health Maintenance  Topic Date Due  . MAMMOGRAM  07/27/2001  . PNA vac Low Risk Adult (2 of 2 - PPSV23) 09/05/2017  . INFLUENZA VACCINE  10/11/2017  . COLONOSCOPY  06/14/2025  . TETANUS/TDAP  09/06/2026  . DEXA SCAN  Completed  . Hepatitis C Screening  Completed    Qualifies for Shingles Vaccine? Due for Shingles vaccine. Declined my offer to administer today. Education has been provided regarding the importance of this vaccine. Pt has been advised to call her insurance company to determine her out of pocket expense. Advised she may also receive this vaccine at her local pharmacy or Health Dept. Verbalized acceptance and understanding.  Cancer Screenings: Lung: Low Dose CT Chest recommended if Age 41-80 years, 30 pack-year currently smoking OR have quit w/in 15years. Patient does qualify. An Epic message has been sent to Burgess Estelle, RN (Oncology Nurse Navigator) regarding the possible need for this exam. Raquel Sarna will review the patient's chart to determine if the patient truly qualifies for the exam. If the patient qualifies, Raquel Sarna will order the Low Dose CT of the chest to facilitate the scheduling of this exam. Breast: Up to date on Mammogram? No, pt to set up this year.    Up to date of Bone Density/Dexa? Yes Colorectal: Up to date  Additional Screenings:  Hepatitis C Screening: Up to date     Plan:    I have personally reviewed and addressed the Medicare Annual Wellness questionnaire and have noted the following in the patient's chart:  A. Medical and social history B. Use of alcohol, tobacco or illicit drugs  C. Current medications and supplements D. Functional ability and status E.  Nutritional status F.  Physical activity G. Advance directives H. List of other physicians I.  Hospitalizations, surgeries, and ER visits in previous 12 months J.  [place] such as hearing and vision if needed, cognitive and depression L. Referrals and appointments - none  In addition, I have reviewed and discussed with patient certain preventive protocols, quality metrics, and best practice recommendations. A written personalized care plan for preventive services as well as general preventive health recommendations were provided to patient.  See attached scanned questionnaire for additional information.   Signed,  Fabio Neighbors, LPN Nurse Health Advisor   Nurse Recommendations: Mammogram order on file, pt to discuss this further with PCP. Pt declined the Pneumovax 23 vaccine today. CT scan referral sent to nurse navigator to set up up due to smoking hx.  Reviewed documentation and recommendations of Nurse Health Advisor for AWE. Was available for consultation. Agree with notes and plan.

## 2017-09-06 NOTE — Progress Notes (Signed)
Patient: Amanda Nelson, Female    DOB: [birth year], 67 y.o.   MRN: [id number] Visit Date: 09/06/2017  Today's Provider: Dortha Kern, PA   Chief Complaint  Patient presents with  . Annual Exam   Subjective:     Complete Physical Amanda Nelson is a 67 y.o. female. She feels well. She reports exercising none. She reports she is sleeping fairly well.  ----------------------------------------------------------- Patient had a AWE prior to appointment today.  Review of Systems  Constitutional: Negative.   HENT: Negative.   Eyes: Negative.   Respiratory: Positive for cough.   Cardiovascular: Negative.   Gastrointestinal: Negative.   Endocrine: Negative.   Genitourinary: Negative.   Musculoskeletal: Negative.   Skin: Negative.   Allergic/Immunologic: Negative.   Neurological: Negative.   Hematological: Negative.   Psychiatric/Behavioral: Negative.    Social History   Socioeconomic History  . Marital status: Married    Spouse name: Not on file  . Number of children: 2  . Years of education: Not on file  . Highest education level: 12th grade  Occupational History  . Occupation: retired  Engineer, production  . Financial resource strain: Not hard at all  . Food insecurity:    Worry: Never true    Inability: Never true  . Transportation needs:    Medical: No    Non-medical: No  Tobacco Use  . Smoking status: Current Every Day Smoker    Packs/day: 0.50    Years: 30.00    Pack years: 15.00    Types: Cigarettes  . Smokeless tobacco: Never Used  . Tobacco comment: 1/2 and 3/4 pack a day   Substance and Sexual Activity  . Alcohol use: No    Alcohol/week: 0.0 oz  . Drug use: No  . Sexual activity: Not on file  Lifestyle  . Physical activity:    Days per week: Not on file    Minutes per session: Not on file  . Stress: Not at all  Relationships  . Social connections:    Talks on phone: Not on file    Gets together: Not on file    Attends religious  service: Not on file    Active member of club or organization: Not on file    Attends meetings of clubs or organizations: Not on file    Relationship status: Not on file  . Intimate partner violence:    Fear of current or ex partner: Not on file    Emotionally abused: Not on file    Physically abused: Not on file    Forced sexual activity: Not on file  Other Topics Concern  . Not on file  Social History Narrative  . Not on file   Past Medical History:  Diagnosis Date  . Anxiety   . Arthritis   . Hyperlipidemia   . Hypertension     Patient Active Problem List   Diagnosis Date Noted  . GERD without esophagitis 03/17/2016  . Fatty infiltration of liver 01/04/2016  . Chronic RUQ pain 01/04/2016  . COPD (chronic obstructive pulmonary disease) (HCC) 01/12/2015  . Chronic sinusitis 12/29/2014  . Dysphagia 12/29/2014  . Esophagitis, reflux 12/29/2014  . Tobacco use 12/29/2014  . Anxiety disorder 12/29/2014  . Essential hypertension 12/29/2014  . Pure hypercholesterolemia 12/29/2014  . Need for prophylactic hormone replacement therapy (postmenopausal) 04/03/2006   Past Surgical History:  Procedure Laterality Date  . TUBAL LIGATION  1981   Her family history includes Cancer in her sister; Kidney  cancer in her sister; Prostate cancer in her brother; Stroke in her brother.     Current Outpatient Medications:  .  lisinopril (PRINIVIL,ZESTRIL) 5 MG tablet, Take 1 tablet (5 mg total) by mouth daily., Disp: 90 tablet, Rfl: 3 .  naproxen sodium (ALEVE) 220 MG tablet, Take 220 mg by mouth as needed., Disp: , Rfl:  .  omeprazole (PRILOSEC) 40 MG capsule, Take 1 capsule (40 mg total) by mouth daily., Disp: 90 capsule, Rfl: 3 .  albuterol (PROVENTIL HFA;VENTOLIN HFA) 108 (90 BASE) MCG/ACT inhaler, Inhale 2 puffs into the lungs every 6 (six) hours as needed for wheezing or shortness of breath. (Patient not taking: Reported on 09/06/2017), Disp: 1 Inhaler, Rfl: 3 .  mometasone-formoterol  (DULERA) 100-5 MCG/ACT AERO, Inhale 2 puffs into the lungs 2 (two) times daily. (Patient not taking: Reported on 09/06/2017), Disp: 8.8 g, Rfl: 0 .  tiotropium (SPIRIVA) 18 MCG inhalation capsule, Place 1 capsule (18 mcg total) into inhaler and inhale daily. (Patient not taking: Reported on 09/06/2017), Disp: 30 capsule, Rfl: 12  Patient Care Team: Chrismon, Jodell Cipro, PA as PCP - General (Family Medicine) Scot Jun, MD as Consulting Physician (Gastroenterology)    Objective:   Vitals: BP (!) 144/72 (BP Location: Right Arm, Patient Position: Sitting, Cuff Size: Normal)   Pulse 66   Temp 98.6 F (37 C) (Oral)   Ht 5' (1.524 m)   Wt 135 lb 6.4 oz (61.4 kg)   BMI 26.44 kg/m   Physical Exam  Constitutional: She is oriented to person, place, and time. She appears well-developed and well-nourished.  HENT:  Head: Normocephalic and atraumatic.  Right Ear: External ear normal.  Left Ear: External ear normal.  Nose: Nose normal.  Mouth/Throat: Oropharynx is clear and moist.  Eyes: Pupils are equal, round, and reactive to light. Conjunctivae and EOM are normal. Right eye exhibits no discharge.  Neck: Normal range of motion. Neck supple. No tracheal deviation present. No thyromegaly present.  Cardiovascular: Normal rate, regular rhythm, normal heart sounds and intact distal pulses.  No murmur heard. Pulmonary/Chest: Effort normal and breath sounds normal. No respiratory distress. She has no wheezes. She has no rales. She exhibits no tenderness.  Abdominal: Soft. She exhibits no distension and no mass. There is no tenderness. There is no rebound and no guarding.  Musculoskeletal: Normal range of motion. She exhibits no edema or tenderness.  Lymphadenopathy:    She has no cervical adenopathy.  Neurological: She is alert and oriented to person, place, and time. She has normal reflexes. She displays normal reflexes. No cranial nerve deficit. She exhibits normal muscle tone. Coordination  normal.  Skin: Skin is warm and dry. No rash noted. No erythema.  Psychiatric: She has a normal mood and affect. Her behavior is normal. Judgment and thought content normal.    Activities of Daily Living In your present state of health, do you have any difficulty performing the following activities: 09/06/2017  Hearing? Y  Comment Trouble hearing out of right ear. Pt interested in seeing an audiologist.  Vision? N  Difficulty concentrating or making decisions? N  Walking or climbing stairs? N  Dressing or bathing? N  Doing errands, shopping? N  Preparing Food and eating ? N  Using the Toilet? N  In the past six months, have you accidently leaked urine? Y  Comment Occasionally when pressure.  Do you have problems with loss of bowel control? N  Managing your Medications? N  Managing your Finances? N  Housekeeping or managing your Housekeeping? N  Some recent data might be hidden    Fall Risk Assessment Fall Risk  09/06/2017 08/24/2016  Falls in the past year? No No     Depression Screen PHQ 2/9 Scores 09/06/2017 09/06/2017 08/24/2016  PHQ - 2 Score 0 0 0  PHQ- 9 Score 0 - -    Assessment & Plan:    Annual Physical Reviewed patient's Family Medical History Reviewed and updated list of patient's medical providers Assessment of cognitive impairment was done Assessed patient's functional ability Established a written schedule for health screening services Health Risk Assessent Completed and Reviewed  Exercise Activities and Dietary recommendations Goals    . DIET - INCREASE WATER INTAKE     Recommend increasing water intake to 4-6 glasses a day.        Immunization History  Administered Date(s) Administered  . Pneumococcal Conjugate-13 09/05/2016  . Tdap 09/05/2016    Health Maintenance  Topic Date Due  . MAMMOGRAM  07/27/2001  . PNA vac Low Risk Adult (2 of 2 - PPSV23) 09/05/2017  . INFLUENZA VACCINE  10/11/2017  . COLONOSCOPY  06/14/2025  . TETANUS/TDAP   09/06/2026  . DEXA SCAN  Completed  . Hepatitis C Screening  Completed     Discussed health benefits of physical activity, and encouraged her to engage in regular exercise appropriate for her age and condition.    ------------------------------------------------------------------------------------------------------------ 1. Chronic obstructive pulmonary disease, unspecified COPD type (HCC) Denies significant dyspnea, but, still has a frequent morning cough. Still smoking 0.5-0.75 ppd for the past 30 years. Spirometry shows moderate airway obstruction with lung age 69 years over stated age. Has not been using inhalers. Requests Spiriva refill and encouraged to stop smoking. Check routine labs. Needs Prevnar-13 but refuses "any shots" today. - CBC with Differential/Platelet - Spirometry with graph - tiotropium (SPIRIVA) 18 MCG inhalation capsule; Place 1 capsule (18 mcg total) into inhaler and inhale daily.  Dispense: 30 capsule; Refill: 12  2. Essential hypertension Fair control of BP on the Lisinopril 5 mg qd without side effects. Will refill medication and get follow up labs. - CBC with Differential/Platelet - Comprehensive metabolic panel - Lipid panel - TSH - lisinopril (PRINIVIL,ZESTRIL) 5 MG tablet; Take 1 tablet (5 mg total) by mouth daily.  Dispense: 90 tablet; Refill: 3  3. Pure hypercholesterolemia Trying to limit fat intake and exercising by mowing. Total cholesterol 282, HDL 75 and LDL 186. Not taking any statins at the present. Will recheck labs. - Comprehensive metabolic panel - Lipid panel - TSH  4. GERD without esophagitis Followed by Dr. Mechele Collin (GI) and had improvement of dysphagia after esophageal dilation in 2017. No further dyspepsia with using Prilosec qd. Recheck CBC, CMP and refilled Prilosec. - CBC with Differential/Platelet - Comprehensive metabolic panel - omeprazole (PRILOSEC) 40 MG capsule; Take 1 capsule (40 mg total) by mouth daily.  Dispense: 90  capsule; Refill: 3  5. Breast cancer screening - MM Digital Screening; Future  6. Fatty infiltration of liver Found on abdominal US by Dr. Mechele Collin (gastroenterologist) 01-13-16. Asymptomatic. Will check labs and follow up with Dr. Mechele Collin as planned. - CBC with Differential/Platelet - Comprehensive metabolic panel - Lipid panel   Dortha Kern, PA  [facility]

## 2017-09-06 NOTE — Patient Instructions (Addendum)
Amanda Nelson , Thank you for taking time to come for your Medicare Wellness Visit. I appreciate your ongoing commitment to your health goals. Please review the following plan we discussed and let me know if I can assist you in the future.   Screening recommendations/referrals: Colonoscopy: Up to date Mammogram: Order on file, pt to set up this year. Bone Density: Up to date Recommended yearly ophthalmology/optometry visit for glaucoma screening and checkup Recommended yearly dental visit for hygiene and checkup  Vaccinations: Influenza vaccine: N/A Pneumococcal vaccine: Prevnar up to date, pt declined pneumovax 23 vaccine today.  Tdap vaccine: Up to date Shingles vaccine: Pt declines today.     Advanced directives: Advance directive discussed with you today. Even though you declined this today please call our office should you change your mind and we can give you the proper paperwork for you to fill out.  Conditions/risks identified: Recommend increasing water intake to 4-6 glasses a day.   Next appointment: 10:00 AM today with Amanda Nelson.   Preventive Care 66 Years and Older, Female Preventive care refers to lifestyle choices and visits with your health care provider that can promote health and wellness. What does preventive care include?  A yearly physical exam. This is also called an annual well check.  Dental exams once or twice a year.  Routine eye exams. Ask your health care provider how often you should have your eyes checked.  Personal lifestyle choices, including:  Daily care of your teeth and gums.  Regular physical activity.  Eating a healthy diet.  Avoiding tobacco and drug use.  Limiting alcohol use.  Practicing safe sex.  Taking low-dose aspirin every day.  Taking vitamin and mineral supplements as recommended by your health care provider. What happens during an annual well check? The services and screenings done by your health care provider  during your annual well check will depend on your age, overall health, lifestyle risk factors, and family history of disease. Counseling  Your health care provider may ask you questions about your:  Alcohol use.  Tobacco use.  Drug use.  Emotional well-being.  Home and relationship well-being.  Sexual activity.  Eating habits.  History of falls.  Memory and ability to understand (cognition).  Work and work Statistician.  Reproductive health. Screening  You may have the following tests or measurements:  Height, weight, and BMI.  Blood pressure.  Lipid and cholesterol levels. These may be checked every 5 years, or more frequently if you are over 23 years old.  Skin check.  Lung cancer screening. You may have this screening every year starting at age 59 if you have a 30-pack-year history of smoking and currently smoke or have quit within the past 15 years.  Fecal occult blood test (FOBT) of the stool. You may have this test every year starting at age 69.  Flexible sigmoidoscopy or colonoscopy. You may have a sigmoidoscopy every 5 years or a colonoscopy every 10 years starting at age 63.  Hepatitis C blood test.  Hepatitis B blood test.  Sexually transmitted disease (STD) testing.  Diabetes screening. This is done by checking your blood sugar (glucose) after you have not eaten for a while (fasting). You may have this done every 1-3 years.  Bone density scan. This is done to screen for osteoporosis. You may have this done starting at age 57.  Mammogram. This may be done every 1-2 years. Talk to your health care provider about how often you should have regular mammograms. Talk  with your health care provider about your test results, treatment options, and if necessary, the need for more tests. Vaccines  Your health care provider may recommend certain vaccines, such as:  Influenza vaccine. This is recommended every year.  Tetanus, diphtheria, and acellular pertussis  (Tdap, Td) vaccine. You may need a Td booster every 10 years.  Zoster vaccine. You may need this after age 20.  Pneumococcal 13-valent conjugate (PCV13) vaccine. One dose is recommended after age 5.  Pneumococcal polysaccharide (PPSV23) vaccine. One dose is recommended after age 22. Talk to your health care provider about which screenings and vaccines you need and how often you need them. This information is not intended to replace advice given to you by your health care provider. Make sure you discuss any questions you have with your health care provider. Document Released: 03/26/2015 Document Revised: 11/17/2015 Document Reviewed: 12/29/2014 Elsevier Interactive Patient Education  2017 [place] Prevention in the Home Falls can cause injuries. They can happen to people of all ages. There are many things you can do to make your home safe and to help prevent falls. What can I do on the outside of my home?  Regularly fix the edges of walkways and driveways and fix any cracks.  Remove anything that might make you trip as you walk through a door, such as a raised step or threshold.  Trim any bushes or trees on the path to your home.  Use bright outdoor lighting.  Clear any walking paths of anything that might make someone trip, such as rocks or tools.  Regularly check to see if handrails are loose or broken. Make sure that both sides of any steps have handrails.  Any raised decks and porches should have guardrails on the edges.  Have any leaves, snow, or ice cleared regularly.  Use sand or salt on walking paths during winter.  Clean up any spills in your garage right away. This includes oil or grease spills. What can I do in the bathroom?  Use night lights.  Install grab bars by the toilet and in the tub and shower. Do not use towel bars as grab bars.  Use non-skid mats or decals in the tub or shower.  If you need to sit down in the shower, use a plastic, non-slip  stool.  Keep the floor dry. Clean up any water that spills on the floor as soon as it happens.  Remove soap buildup in the tub or shower regularly.  Attach bath mats securely with double-sided non-slip rug tape.  Do not have throw rugs and other things on the floor that can make you trip. What can I do in the bedroom?  Use night lights.  Make sure that you have a light by your bed that is easy to reach.  Do not use any sheets or blankets that are too big for your bed. They should not hang down onto the floor.  Have a firm chair that has side arms. You can use this for support while you get dressed.  Do not have throw rugs and other things on the floor that can make you trip. What can I do in the kitchen?  Clean up any spills right away.  Avoid walking on wet floors.  Keep items that you use a lot in easy-to-reach places.  If you need to reach something above you, use a strong step stool that has a grab bar.  Keep electrical cords out of the way.  Do  not use floor polish or wax that makes floors slippery. If you must use wax, use non-skid floor wax.  Do not have throw rugs and other things on the floor that can make you trip. What can I do with my stairs?  Do not leave any items on the stairs.  Make sure that there are handrails on both sides of the stairs and use them. Fix handrails that are broken or loose. Make sure that handrails are as long as the stairways.  Check any carpeting to make sure that it is firmly attached to the stairs. Fix any carpet that is loose or worn.  Avoid having throw rugs at the top or bottom of the stairs. If you do have throw rugs, attach them to the floor with carpet tape.  Make sure that you have a light switch at the top of the stairs and the bottom of the stairs. If you do not have them, ask someone to add them for you. What else can I do to help prevent falls?  Wear shoes that:  Do not have high heels.  Have rubber bottoms.  Are  comfortable and fit you well.  Are closed at the toe. Do not wear sandals.  If you use a stepladder:  Make sure that it is fully opened. Do not climb a closed stepladder.  Make sure that both sides of the stepladder are locked into place.  Ask someone to hold it for you, if possible.  Clearly mark and make sure that you can see:  Any grab bars or handrails.  First and last steps.  Where the edge of each step is.  Use tools that help you move around (mobility aids) if they are needed. These include:  Canes.  Walkers.  Scooters.  Crutches.  Turn on the lights when you go into a dark area. Replace any light bulbs as soon as they burn out.  Set up your furniture so you have a clear path. Avoid moving your furniture around.  If any of your floors are uneven, fix them.  If there are any pets around you, be aware of where they are.  Review your medicines with your doctor. Some medicines can make you feel dizzy. This can increase your chance of falling. Ask your doctor what other things that you can do to help prevent falls. This information is not intended to replace advice given to you by your health care provider. Make sure you discuss any questions you have with your health care provider. Document Released: 12/24/2008 Document Revised: 08/05/2015 Document Reviewed: 04/03/2014 Elsevier Interactive Patient Education  2017 [employer].

## 2017-09-07 LAB — LIPID PANEL
CHOLESTEROL TOTAL: 272 mg/dL — AB (ref 100–199)
Chol/HDL Ratio: 3.5 ratio (ref 0.0–4.4)
HDL: 78 mg/dL (ref >39)
LDL Calculated: 172 mg/dL — ABNORMAL HIGH (ref 0–99)
TRIGLYCERIDES: 108 mg/dL (ref 0–149)
VLDL Cholesterol Cal: 22 mg/dL (ref 5–40)

## 2017-09-07 LAB — CBC WITH DIFFERENTIAL/PLATELET
BASOS ABS: 0.1 10*3/uL (ref 0.0–0.2)
Basos: 1 %
EOS (ABSOLUTE): 0.1 10*3/uL (ref 0.0–0.4)
EOS: 2 %
Hematocrit: 41.8 % (ref 34.0–46.6)
Hemoglobin: 13.9 g/dL (ref 11.1–15.9)
Immature Grans (Abs): 0 10*3/uL (ref 0.0–0.1)
Immature Granulocytes: 0 %
Lymphocytes Absolute: 2.9 10*3/uL (ref 0.7–3.1)
Lymphs: 39 %
MCH: 30.5 pg (ref 26.6–33.0)
MCHC: 33.3 g/dL (ref 31.5–35.7)
MCV: 92 fL (ref 79–97)
MONOCYTES: 11 %
MONOS ABS: 0.8 10*3/uL (ref 0.1–0.9)
Neutrophils Absolute: 3.6 10*3/uL (ref 1.4–7.0)
Neutrophils: 47 %
PLATELETS: 225 10*3/uL (ref 150–450)
RBC: 4.56 x10E6/uL (ref 3.77–5.28)
RDW: 14.2 % (ref 12.3–15.4)
WBC: 7.6 10*3/uL (ref 3.4–10.8)

## 2017-09-07 LAB — COMPREHENSIVE METABOLIC PANEL
A/G RATIO: 1.8 (ref 1.2–2.2)
ALK PHOS: 66 IU/L (ref 39–117)
ALT: 12 IU/L (ref 0–32)
AST: 14 IU/L (ref 0–40)
Albumin: 4.4 g/dL (ref 3.6–4.8)
BILIRUBIN TOTAL: 0.5 mg/dL (ref 0.0–1.2)
BUN/Creatinine Ratio: 15 (ref 12–28)
BUN: 10 mg/dL (ref 8–27)
CHLORIDE: 102 mmol/L (ref 96–106)
CO2: 23 mmol/L (ref 20–29)
Calcium: 9.6 mg/dL (ref 8.7–10.3)
Creatinine, Ser: 0.66 mg/dL (ref 0.57–1.00)
GFR calc Af Amer: 106 mL/min/{1.73_m2} (ref >59)
GFR calc non Af Amer: 92 mL/min/{1.73_m2} (ref >59)
Globulin, Total: 2.5 g/dL (ref 1.5–4.5)
Glucose: 83 mg/dL (ref 65–99)
POTASSIUM: 4.3 mmol/L (ref 3.5–5.2)
Sodium: 139 mmol/L (ref 134–144)
Total Protein: 6.9 g/dL (ref 6.0–8.5)

## 2017-09-07 LAB — TSH: TSH: 1.44 u[IU]/mL (ref 0.450–4.500)

## 2018-09-10 ENCOUNTER — Ambulatory Visit: Payer: Medicare HMO

## 2018-09-10 ENCOUNTER — Encounter: Payer: Medicare HMO | Admitting: Family Medicine

## 2018-10-08 ENCOUNTER — Other Ambulatory Visit: Payer: Self-pay | Admitting: Family Medicine

## 2018-10-08 DIAGNOSIS — I10 Essential (primary) hypertension: Secondary | ICD-10-CM

## 2018-10-08 NOTE — Telephone Encounter (Signed)
Left message for patient to call back about appointment.

## 2018-10-08 NOTE — Telephone Encounter (Signed)
Remind patient - due for follow up labs and appointment in office or electronically in 2 months.

## 2019-03-12 ENCOUNTER — Ambulatory Visit: Payer: 59 | Attending: Internal Medicine

## 2019-03-12 DIAGNOSIS — Z20822 Contact with and (suspected) exposure to covid-19: Secondary | ICD-10-CM

## 2019-03-13 LAB — NOVEL CORONAVIRUS, NAA: SARS-CoV-2, NAA: NOT DETECTED

## 2019-03-17 ENCOUNTER — Telehealth: Payer: Self-pay

## 2019-03-17 NOTE — Telephone Encounter (Signed)
Caller given negative result and verbalized understanding  

## 2019-03-19 NOTE — Progress Notes (Addendum)
Subjective:   Amanda Nelson is a 69 y.o. female who presents for Medicare Annual (Subsequent) preventive examination.    This visit is being conducted through telemedicine due to the COVID-19 pandemic. This patient has given me verbal consent via doximity to conduct this visit, patient states they are participating from their home address. Some vital signs may be absent or patient reported.    Patient identification: identified by name, DOB, and current address  Review of Systems:  N/A  Cardiac Risk Factors include: advanced age (>57men, >18 women);smoking/ tobacco exposure;hypertension     Objective:     Vitals: There were no vitals taken for this visit.  There is no height or weight on file to calculate BMI. Unable to obtain vitals due to visit being conducted via telephonically.   Advanced Directives 03/20/2019 09/06/2017 08/24/2016  Does Patient Have a Medical Advance Directive? No No No  Would patient like information on creating a medical advance directive? No - Patient declined No - Patient declined -    Tobacco Social History   Tobacco Use  Smoking Status Current Every Day Smoker   Packs/day: 0.50   Years: 30.00   Pack years: 15.00   Types: Cigarettes  Smokeless Tobacco Never Used  Tobacco Comment   1/2 and 3/4 pack a day      Ready to quit: No Counseling given: No Comment: 1/2 and 3/4 pack a day    Clinical Intake:  Pre-visit preparation completed: Yes  Pain : No/denies pain Pain Score: 0-No pain     Nutritional Risks: None Diabetes: No  How often do you need to have someone help you when you read instructions, pamphlets, or other written materials from your doctor or pharmacy?: 1 - Never  Interpreter Needed?: No  Information entered by :: [facility], LPN  Past Medical History:  Diagnosis Date   Anxiety    Arthritis    Hyperlipidemia    Hypertension    Past Surgical History:  Procedure Laterality Date   TUBAL LIGATION  1981    Family History  Problem Relation Age of Onset   Stroke Brother    Prostate cancer Brother    Kidney cancer Sister    Cancer Sister        cancer   Social History   Socioeconomic History   Marital status: Married    Spouse name: Not on file   Number of children: 2   Years of education: Not on file   Highest education level: 12th grade  Occupational History   Occupation: retired  Tobacco Use   Smoking status: Current Every Day Smoker    Packs/day: 0.50    Years: 30.00    Pack years: 15.00    Types: Cigarettes   Smokeless tobacco: Never Used   Tobacco comment: 1/2 and 3/4 pack a day   Substance and Sexual Activity   Alcohol use: No    Alcohol/week: 0.0 standard drinks   Drug use: No   Sexual activity: Not on file  Other Topics Concern   Not on file  Social History Narrative   Not on file   Social Determinants of Health   Financial Resource Strain: Low Risk    Difficulty of Paying Living Expenses: Not hard at all  Food Insecurity: No Food Insecurity   Worried About Charity fundraiser in the Last Year: Never true   [place] in the Last Year: Never true  Transportation Needs: No Transportation Needs   Lack  of Transportation (Medical): No   Lack of Transportation (Non-Medical): No  Physical Activity: Inactive   Days of Exercise per Week: 0 days   Minutes of Exercise per Session: 0 min  Stress: No Stress Concern Present   Feeling of Stress : Not at all  Social Connections: Slightly Isolated   Frequency of Communication with Friends and Family: More than three times a week   Frequency of Social Gatherings with Friends and Family: More than three times a week   Attends Religious Services: More than 4 times per year   Active Member of [employer] or Organizations: No   Attends Archivist Meetings: Never   Marital Status: Married    Outpatient Encounter Medications as of 03/20/2019  Medication Sig   albuterol (PROVENTIL HFA;VENTOLIN HFA) 108 (90 BASE)  MCG/ACT inhaler Inhale 2 puffs into the lungs every 6 (six) hours as needed for wheezing or shortness of breath.   lisinopril (ZESTRIL) 5 MG tablet TAKE 1 TABLET EVERY DAY   naproxen sodium (ALEVE) 220 MG tablet Take 220 mg by mouth as needed.   mometasone-formoterol (DULERA) 100-5 MCG/ACT AERO Inhale 2 puffs into the lungs 2 (two) times daily. (Patient not taking: Reported on 09/06/2017)   omeprazole (PRILOSEC) 40 MG capsule Take 1 capsule (40 mg total) by mouth daily. (Patient not taking: Reported on 03/20/2019)   tiotropium (SPIRIVA) 18 MCG inhalation capsule Place 1 capsule (18 mcg total) into inhaler and inhale daily. (Patient not taking: Reported on 03/20/2019)   No facility-administered encounter medications on file as of 03/20/2019.    Activities of Daily Living In your present state of health, do you have any difficulty performing the following activities: 03/20/2019  Hearing? Y  Comment Has hearing loss in right ear. Does not wear hearing aids.  Vision? N  Difficulty concentrating or making decisions? N  Walking or climbing stairs? N  Dressing or bathing? N  Doing errands, shopping? N  Preparing Food and eating ? N  Using the Toilet? N  In the past six months, have you accidently leaked urine? N  Do you have problems with loss of bowel control? N  Managing your Medications? N  Managing your Finances? N  Housekeeping or managing your Housekeeping? N  Some recent data might be hidden    Patient Care Team: Chrismon, Vickki Muff, PA as PCP - General (Family Medicine)    Assessment:   This is a routine wellness examination for Amanda Nelson.  Exercise Activities and Dietary recommendations Current Exercise Habits: The patient does not participate in regular exercise at present, Exercise limited by: None identified  Goals      DIET - INCREASE WATER INTAKE     Recommend increasing water intake to 4-6 glasses a day.         Fall Risk: Fall Risk  03/20/2019 09/06/2017 08/24/2016  Falls  in the past year? 0 No No  Number falls in past yr: 0 - -  Injury with Fall? 0 - -    FALL RISK PREVENTION PERTAINING TO THE HOME:  Any stairs in or around the home? No  If so, are there any without handrails?  N/A  Home free of loose throw rugs in walkways, pet beds, electrical cords, etc? Yes  Adequate lighting in your home to reduce risk of falls? Yes   ASSISTIVE DEVICES UTILIZED TO PREVENT FALLS:  Life alert? No  Use of a cane, walker or w/c? No  Grab bars in the bathroom? No  Shower chair or  bench in shower? No  Elevated toilet seat or a handicapped toilet? No    TIMED UP AND GO:  Was the test performed? No .    Depression Screen PHQ 2/9 Scores 03/20/2019 03/20/2019 09/06/2017 09/06/2017  PHQ - 2 Score 0 0 0 0  PHQ- 9 Score - - 0 -     Cognitive Function: Declined today.      6CIT Screen 08/24/2016  What Year? 0 points  What month? 0 points  What time? 0 points  Count back from 20 0 points  Months in reverse 0 points  Repeat phrase 10 points  Total Score 10    Immunization History  Administered Date(s) Administered   Pneumococcal Conjugate-13 09/05/2016   Tdap 09/05/2016    Qualifies for Shingles Vaccine? Yes . Due for Shingrix. Pt has been advised to call insurance company to determine out of pocket expense. Advised may also receive vaccine at local pharmacy or Health Dept. Verbalized acceptance and understanding.  Tdap: Up to date  Flu Vaccine: Due for Flu vaccine. Does the patient want to receive this vaccine today?  No . Advised may receive this vaccine at local pharmacy or Health Dept. Aware to provide a copy of the vaccination record if obtained from local pharmacy or Health Dept. Verbalized acceptance and understanding.  Pneumococcal Vaccine: Due for Pneumococcal vaccine. Does the patient want to receive this vaccine today?  No . Advised may receive this vaccine at local pharmacy or Health Dept. Aware to provide a copy of the vaccination record if  obtained from local pharmacy or Health Dept. Verbalized acceptance and understanding.   Screening Tests Health Maintenance  Topic Date Due   MAMMOGRAM  07/27/2001   DEXA SCAN  07/15/2012   PNA vac Low Risk Adult (2 of 2 - PPSV23) 09/05/2017   INFLUENZA VACCINE  06/11/2019 (Originally 10/12/2018)   COLONOSCOPY  06/14/2025   TETANUS/TDAP  09/06/2026   Hepatitis C Screening  Completed    Cancer Screenings:  Colorectal Screening: Completed 06/15/15. Repeat every 10 years.   Mammogram: Currently due. Pt declined order at this time.   Bone Density: Completed 07/16/07. Results reflect OSTEOPENIA. Repeat every 5 years. Declined order at this time.  Lung Cancer Screening: (Low Dose CT Chest recommended if Age 29-80 years, 30 pack-year currently smoking OR have quit w/in 15years.) does qualify however declines order at this time.  Additional Screening:  Hepatitis C Screening: Up to date  Vision Screening: Recommended annual ophthalmology exams for early detection of glaucoma and other disorders of the eye.  Dental Screening: Recommended annual dental exams for proper oral hygiene  Community Resource Referral:  CRR required this visit?  No       Plan:  I have personally reviewed and addressed the Medicare Annual Wellness questionnaire and have noted the following in the patient's chart:  A. Medical and social history B. Use of alcohol, tobacco or illicit drugs  C. Current medications and supplements D. Functional ability and status E.  Nutritional status F.  Physical activity G. Advance directives H. List of other physicians I.  Hospitalizations, surgeries, and ER visits in previous 12 months J.  Oriental such as hearing and vision if needed, cognitive and depression L. Referrals and appointments   In addition, I have reviewed and discussed with patient certain preventive protocols, quality metrics, and best practice recommendations. A written personalized care plan for  preventive services as well as general preventive health recommendations were provided to patient. Nurse Health Advisor  [place], [place]  [phone] Nurse Health Advisor   Nurse Notes: Pt needs a Pneumovax 23 vaccine at next in office apt. Pt declined a mammogram and DEXA order. Pt would like to wait until the Covid pandemic has calmed down before going out again.  Reviewed note and plan of Nurse Health Advisor. Agree with documentation and plan.

## 2019-03-20 ENCOUNTER — Other Ambulatory Visit: Payer: Self-pay

## 2019-03-20 ENCOUNTER — Ambulatory Visit (INDEPENDENT_AMBULATORY_CARE_PROVIDER_SITE_OTHER): Payer: Medicare HMO

## 2019-03-20 DIAGNOSIS — Z0000 Encounter for general adult medical examination without abnormal findings: Secondary | ICD-10-CM

## 2019-03-20 NOTE — Patient Instructions (Signed)
Amanda Nelson , Thank you for taking time to come for your Medicare Wellness Visit. I appreciate your ongoing commitment to your health goals. Please review the following plan we discussed and let me know if I can assist you in the future.   Screening recommendations/referrals: Colonoscopy: Up to date, due 06/2025 Mammogram: Currently due. Declined order today.  Bone Density: Currently due. Declined order today.  Recommended yearly ophthalmology/optometry visit for glaucoma screening and checkup Recommended yearly dental visit for hygiene and checkup  Vaccinations: Influenza vaccine: Pt declines today.  Pneumococcal vaccine: Pneumovax 23 due. Pt to receive at next in office apt.  Tdap vaccine: Up to date, due 08/2026 Shingles vaccine: Pt declines today.     Advanced directives: Advance directive discussed with you today. Even though you declined this today please call our office should you change your mind and we can give you the proper paperwork for you to fill out.  Conditions/risks identified: Recommend to increase water intake to 6-8 8 oz glasses a day.   Next appointment: 03/24/20 @ 9:00 AM for an AWV. Declined scheduling a follow up with PCP at this time.    Preventive Care 68 Years and Older, Female Preventive care refers to lifestyle choices and visits with your health care provider that can promote health and wellness. What does preventive care include?  A yearly physical exam. This is also called an annual well check.  Dental exams once or twice a year.  Routine eye exams. Ask your health care provider how often you should have your eyes checked.  Personal lifestyle choices, including:  Daily care of your teeth and gums.  Regular physical activity.  Eating a healthy diet.  Avoiding tobacco and drug use.  Limiting alcohol use.  Practicing safe sex.  Taking low-dose aspirin every day.  Taking vitamin and mineral supplements as recommended by your health care  provider. What happens during an annual well check? The services and screenings done by your health care provider during your annual well check will depend on your age, overall health, lifestyle risk factors, and family history of disease. Counseling  Your health care provider may ask you questions about your:  Alcohol use.  Tobacco use.  Drug use.  Emotional well-being.  Home and relationship well-being.  Sexual activity.  Eating habits.  History of falls.  Memory and ability to understand (cognition).  Work and work Statistician.  Reproductive health. Screening  You may have the following tests or measurements:  Height, weight, and BMI.  Blood pressure.  Lipid and cholesterol levels. These may be checked every 5 years, or more frequently if you are over 51 years old.  Skin check.  Lung cancer screening. You may have this screening every year starting at age 59 if you have a 30-pack-year history of smoking and currently smoke or have quit within the past 15 years.  Fecal occult blood test (FOBT) of the stool. You may have this test every year starting at age 70.  Flexible sigmoidoscopy or colonoscopy. You may have a sigmoidoscopy every 5 years or a colonoscopy every 10 years starting at age 76.  Hepatitis C blood test.  Hepatitis B blood test.  Sexually transmitted disease (STD) testing.  Diabetes screening. This is done by checking your blood sugar (glucose) after you have not eaten for a while (fasting). You may have this done every 1-3 years.  Bone density scan. This is done to screen for osteoporosis. You may have this done starting at age 18.  Mammogram. This may be done every 1-2 years. Talk to your health care provider about how often you should have regular mammograms. Talk with your health care provider about your test results, treatment options, and if necessary, the need for more tests. Vaccines  Your health care provider may recommend certain  vaccines, such as:  Influenza vaccine. This is recommended every year.  Tetanus, diphtheria, and acellular pertussis (Tdap, Td) vaccine. You may need a Td booster every 10 years.  Zoster vaccine. You may need this after age 59.  Pneumococcal 13-valent conjugate (PCV13) vaccine. One dose is recommended after age 22.  Pneumococcal polysaccharide (PPSV23) vaccine. One dose is recommended after age 69. Talk to your health care provider about which screenings and vaccines you need and how often you need them. This information is not intended to replace advice given to you by your health care provider. Make sure you discuss any questions you have with your health care provider. Document Released: 03/26/2015 Document Revised: 11/17/2015 Document Reviewed: 12/29/2014 Elsevier Interactive Patient Education  2017 [place] Prevention in the Home Falls can cause injuries. They can happen to people of all ages. There are many things you can do to make your home safe and to help prevent falls. What can I do on the outside of my home?  Regularly fix the edges of walkways and driveways and fix any cracks.  Remove anything that might make you trip as you walk through a door, such as a raised step or threshold.  Trim any bushes or trees on the path to your home.  Use bright outdoor lighting.  Clear any walking paths of anything that might make someone trip, such as rocks or tools.  Regularly check to see if handrails are loose or broken. Make sure that both sides of any steps have handrails.  Any raised decks and porches should have guardrails on the edges.  Have any leaves, snow, or ice cleared regularly.  Use sand or salt on walking paths during winter.  Clean up any spills in your garage right away. This includes oil or grease spills. What can I do in the bathroom?  Use night lights.  Install grab bars by the toilet and in the tub and shower. Do not use towel bars as grab  bars.  Use non-skid mats or decals in the tub or shower.  If you need to sit down in the shower, use a plastic, non-slip stool.  Keep the floor dry. Clean up any water that spills on the floor as soon as it happens.  Remove soap buildup in the tub or shower regularly.  Attach bath mats securely with double-sided non-slip rug tape.  Do not have throw rugs and other things on the floor that can make you trip. What can I do in the bedroom?  Use night lights.  Make sure that you have a light by your bed that is easy to reach.  Do not use any sheets or blankets that are too big for your bed. They should not hang down onto the floor.  Have a firm chair that has side arms. You can use this for support while you get dressed.  Do not have throw rugs and other things on the floor that can make you trip. What can I do in the kitchen?  Clean up any spills right away.  Avoid walking on wet floors.  Keep items that you use a lot in easy-to-reach places.  If you need to reach  something above you, use a strong step stool that has a grab bar.  Keep electrical cords out of the way.  Do not use floor polish or wax that makes floors slippery. If you must use wax, use non-skid floor wax.  Do not have throw rugs and other things on the floor that can make you trip. What can I do with my stairs?  Do not leave any items on the stairs.  Make sure that there are handrails on both sides of the stairs and use them. Fix handrails that are broken or loose. Make sure that handrails are as long as the stairways.  Check any carpeting to make sure that it is firmly attached to the stairs. Fix any carpet that is loose or worn.  Avoid having throw rugs at the top or bottom of the stairs. If you do have throw rugs, attach them to the floor with carpet tape.  Make sure that you have a light switch at the top of the stairs and the bottom of the stairs. If you do not have them, ask someone to add them for  you. What else can I do to help prevent falls?  Wear shoes that:  Do not have high heels.  Have rubber bottoms.  Are comfortable and fit you well.  Are closed at the toe. Do not wear sandals.  If you use a stepladder:  Make sure that it is fully opened. Do not climb a closed stepladder.  Make sure that both sides of the stepladder are locked into place.  Ask someone to hold it for you, if possible.  Clearly mark and make sure that you can see:  Any grab bars or handrails.  First and last steps.  Where the edge of each step is.  Use tools that help you move around (mobility aids) if they are needed. These include:  Canes.  Walkers.  Scooters.  Crutches.  Turn on the lights when you go into a dark area. Replace any light bulbs as soon as they burn out.  Set up your furniture so you have a clear path. Avoid moving your furniture around.  If any of your floors are uneven, fix them.  If there are any pets around you, be aware of where they are.  Review your medicines with your doctor. Some medicines can make you feel dizzy. This can increase your chance of falling. Ask your doctor what other things that you can do to help prevent falls. This information is not intended to replace advice given to you by your health care provider. Make sure you discuss any questions you have with your health care provider. Document Released: 12/24/2008 Document Revised: 08/05/2015 Document Reviewed: 04/03/2014 Elsevier Interactive Patient Education  2017 [employer].

## 2020-01-19 ENCOUNTER — Other Ambulatory Visit: Payer: Self-pay | Admitting: Family Medicine

## 2020-01-19 DIAGNOSIS — I10 Essential (primary) hypertension: Secondary | ICD-10-CM

## 2020-01-19 NOTE — Telephone Encounter (Signed)
Requested medication (s) are due for refill today: yes  Requested medication (s) are on the active medication list: yes  Last refill:  10/08/18 #90 4 refills   Future visit scheduled: no   Notes to clinic:  expired medication. Do you want to renew?     Requested Prescriptions  Pending Prescriptions Disp Refills   lisinopril (ZESTRIL) 5 MG tablet [Pharmacy Med Name: LISINOPRIL 5 MG Tablet] 90 tablet 4    Sig: TAKE 1 TABLET EVERY DAY      Cardiovascular:  ACE Inhibitors Failed - 01/19/2020  1:53 PM      Failed - Cr in normal range and within 180 days    Creatinine  Date Value Ref Range Status  07/02/2014 0.64 mg/dL Final    Comment:    0.44-1.00 NOTE: New Reference Range  05/19/14    Creatinine, Ser  Date Value Ref Range Status  09/06/2017 0.66 0.57 - 1.00 mg/dL Final          Failed - K in normal range and within 180 days    Potassium  Date Value Ref Range Status  09/06/2017 4.3 3.5 - 5.2 mmol/L Final  07/02/2014 3.8 mmol/L Final    Comment:    3.5-5.1 NOTE: New Reference Range  05/19/14           Failed - Last BP in normal range    BP Readings from Last 1 Encounters:  09/06/17 (!) 144/72          Failed - Valid encounter within last 6 months    Recent Outpatient Visits           2 years ago Chronic obstructive pulmonary disease, unspecified COPD type ([place])   [place], Vickki Muff, [place]   3 years ago GYN exam for high-risk Medicare patient   [place], [place]   3 years ago 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

## 2020-01-19 NOTE — Telephone Encounter (Signed)
Please review.  Pt has not seen you since 08/2017  Thanks,   -Mickel Baas

## 2020-01-28 ENCOUNTER — Other Ambulatory Visit: Payer: Self-pay | Admitting: Family Medicine

## 2020-01-28 DIAGNOSIS — I10 Essential (primary) hypertension: Secondary | ICD-10-CM

## 2020-02-16 ENCOUNTER — Other Ambulatory Visit: Payer: Self-pay | Admitting: Family Medicine

## 2020-02-16 DIAGNOSIS — I10 Essential (primary) hypertension: Secondary | ICD-10-CM

## 2020-02-16 NOTE — Telephone Encounter (Signed)
Requested medication (s) are due for refill today: expired medication  Requested medication (s) are on the active medication list: yes  Last refill:  10/08/2018 #90 4 refills  Future visit scheduled: yes tomorrow  Notes to clinic:  expired medication. Do you want to refill Rx? Last labs 09/06/2017     Requested Prescriptions  Pending Prescriptions Disp Refills   lisinopril (ZESTRIL) 5 MG tablet 90 tablet 4    Sig: Take 1 tablet (5 mg total) by mouth daily.      Cardiovascular:  ACE Inhibitors Failed - 02/16/2020  3:45 PM      Failed - Cr in normal range and within 180 days    Creatinine  Date Value Ref Range Status  07/02/2014 0.64 mg/dL Final    Comment:    0.44-1.00 NOTE: New Reference Range  05/19/14    Creatinine, Ser  Date Value Ref Range Status  09/06/2017 0.66 0.57 - 1.00 mg/dL Final          Failed - K in normal range and within 180 days    Potassium  Date Value Ref Range Status  09/06/2017 4.3 3.5 - 5.2 mmol/L Final  07/02/2014 3.8 mmol/L Final    Comment:    3.5-5.1 NOTE: New Reference Range  05/19/14           Failed - Last BP in normal range    BP Readings from Last 1 Encounters:  09/06/17 (!) 144/72          Failed - Valid encounter within last 6 months    Recent Outpatient Visits           2 years ago Chronic obstructive pulmonary disease, unspecified COPD type ([place])   [place], Vickki Muff, [place]   3 years ago GYN exam for high-risk Medicare patient   [place], [place]   3 years ago 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

## 2020-02-16 NOTE — Telephone Encounter (Signed)
Medication: lisinopril (ZESTRIL) 5 MG tablet [[id number]]   Has the patient contacted their pharmacy? YES  (Agent: If no, request that the patient contact the pharmacy for the refill.) (Agent: If yes, when and what did the pharmacy advise?)  Preferred Pharmacy (with phone number or street name): [place], [place] [address] Phone: [phone] Fax: [phone] Hours: Not open 24 hours    Agent: Please be advised that RX refills may take up to 3 business days. We ask that you follow-up with your pharmacy.

## 2020-02-17 ENCOUNTER — Ambulatory Visit (INDEPENDENT_AMBULATORY_CARE_PROVIDER_SITE_OTHER): Payer: Medicare HMO | Admitting: Family Medicine

## 2020-02-17 ENCOUNTER — Encounter: Payer: Self-pay | Admitting: Family Medicine

## 2020-02-17 ENCOUNTER — Other Ambulatory Visit: Payer: Self-pay

## 2020-02-17 VITALS — BP 158/55 | HR 64 | Temp 98.5°F | Resp 16 | Ht 60.0 in | Wt 118.0 lb

## 2020-02-17 DIAGNOSIS — Z1231 Encounter for screening mammogram for malignant neoplasm of breast: Secondary | ICD-10-CM

## 2020-02-17 DIAGNOSIS — E7800 Pure hypercholesterolemia, unspecified: Secondary | ICD-10-CM

## 2020-02-17 DIAGNOSIS — Z23 Encounter for immunization: Secondary | ICD-10-CM | POA: Diagnosis not present

## 2020-02-17 DIAGNOSIS — I10 Essential (primary) hypertension: Secondary | ICD-10-CM | POA: Diagnosis not present

## 2020-02-17 DIAGNOSIS — Z720 Tobacco use: Secondary | ICD-10-CM

## 2020-02-17 MED ORDER — LISINOPRIL 5 MG PO TABS: 5.0000 mg | ORAL_TABLET | Freq: Every day | ORAL | 4 refills | Status: DC

## 2020-02-17 NOTE — Progress Notes (Signed)
Established patient visit   Patient: Amanda Nelson   DOB: 02/15/1951   69 y.o. Female  MRN: [id number] Visit Date: 02/17/2020  Today's healthcare provider: Dortha Kern, PA   Chief Complaint  Patient presents with  . Hypertension   Subjective    HPI  Hypertension, follow-up  BP Readings from Last 3 Encounters:  02/17/20 (!) 158/55  09/06/17 (!) 144/72  09/06/17 (!) 146/76   Wt Readings from Last 3 Encounters:  02/17/20 118 lb (53.5 kg)  09/06/17 135 lb 6.4 oz (61.4 kg)  09/06/17 135 lb 6.4 oz (61.4 kg)     She was last seen for hypertension 2 years ago.  BP at that visit was 144/72. Management since that visit includes no changes.  She reports good compliance with treatment. She is not having side effects.  She is following a Regular diet. She is not exercising.  She does smoke.  Use of agents associated with hypertension: none.   Outside blood pressures are checked occasionally. Patient reports that it averages in the 130s/70s at home.  Symptoms: No chest pain No chest pressure  No palpitations No syncope  No dyspnea No orthopnea  No paroxysmal nocturnal dyspnea No lower extremity edema   Pertinent labs: Lab Results  Component Value Date   CHOL 272 (H) 09/06/2017   HDL 78 09/06/2017   LDLCALC 172 (H) 09/06/2017   TRIG 108 09/06/2017   CHOLHDL 3.5 09/06/2017   Lab Results  Component Value Date   NA 139 09/06/2017   K 4.3 09/06/2017   CREATININE 0.66 09/06/2017   GFRNONAA 92 09/06/2017   GFRAA 106 09/06/2017   GLUCOSE 83 09/06/2017     The 10-year ASCVD risk score Denman George DC Jr., et al., 2013) is: 25.3%   Past Medical History:  Diagnosis Date  . Anxiety   . Arthritis   . Hyperlipidemia   . Hypertension    Past Surgical History:  Procedure Laterality Date  . TUBAL LIGATION  1981   Social History   Tobacco Use  . Smoking status: Current Every Day Smoker    Packs/day: 0.50    Years: 30.00    Pack years: 15.00    Types:  Cigarettes  . Smokeless tobacco: Never Used  . Tobacco comment: 1/2 and 3/4 pack a day   Vaping Use  . Vaping Use: Never used  Substance Use Topics  . Alcohol use: No    Alcohol/week: 0.0 standard drinks  . Drug use: No   Family History  Problem Relation Age of Onset  . Stroke Brother   . Prostate cancer Brother   . Kidney cancer Sister   . Cancer Sister        cancer   No Known Allergies     Medications: Outpatient Medications Prior to Visit  Medication Sig  . lisinopril (ZESTRIL) 5 MG tablet TAKE 1 TABLET EVERY DAY  . albuterol (PROVENTIL HFA;VENTOLIN HFA) 108 (90 BASE) MCG/ACT inhaler Inhale 2 puffs into the lungs every 6 (six) hours as needed for wheezing or shortness of breath.  . mometasone-formoterol (DULERA) 100-5 MCG/ACT AERO Inhale 2 puffs into the lungs 2 (two) times daily. (Patient not taking: Reported on 09/06/2017)  . naproxen sodium (ALEVE) 220 MG tablet Take 220 mg by mouth as needed.  Marland Kitchen omeprazole (PRILOSEC) 40 MG capsule Take 1 capsule (40 mg total) by mouth daily. (Patient not taking: Reported on 03/20/2019)  . tiotropium (SPIRIVA) 18 MCG inhalation capsule Place 1 capsule (18 mcg total)  into inhaler and inhale daily. (Patient not taking: Reported on 03/20/2019)   No facility-administered medications prior to visit.    Review of Systems  Constitutional: Negative.   Respiratory: Negative.   Cardiovascular: Negative.   Musculoskeletal: Negative.   Neurological: Negative.       Objective    BP (!) 158/55   Pulse 64   Temp 98.5 F (36.9 C)   Resp 16   Ht 5' (1.524 m)   Wt 118 lb (53.5 kg)   BMI 23.05 kg/m  BP Readings from Last 3 Encounters:  02/17/20 (!) 158/55  09/06/17 (!) 144/72  09/06/17 (!) 146/76   Wt Readings from Last 3 Encounters:  02/17/20 118 lb (53.5 kg)  09/06/17 135 lb 6.4 oz (61.4 kg)  09/06/17 135 lb 6.4 oz (61.4 kg)      Physical Exam Constitutional:      General: She is not in acute distress.    Appearance: She is  well-developed.  HENT:     Head: Normocephalic and atraumatic.     Right Ear: Hearing normal.     Left Ear: Hearing normal.     Nose: Nose normal.  Eyes:     General: Lids are normal. No scleral icterus.       Right eye: No discharge.        Left eye: No discharge.     Conjunctiva/sclera: Conjunctivae normal.  Cardiovascular:     Rate and Rhythm: Normal rate and regular rhythm.     Heart sounds: Normal heart sounds.  Pulmonary:     Effort: Pulmonary effort is normal. No respiratory distress.     Breath sounds: Normal breath sounds.  Musculoskeletal:        General: Normal range of motion.     Cervical back: Neck supple.  Skin:    Findings: No lesion or rash.  Neurological:     Mental Status: She is alert and oriented to person, place, and time.  Psychiatric:        Speech: Speech normal.        Behavior: Behavior normal.        Thought Content: Thought content normal.      No results found for any visits on 02/17/20.  Assessment & Plan     1. Essential hypertension Slight increase in systolic pressure. Will refill Lisinopril daily and recheck labs. Recommend she stop all smoking (doubt she will). Recheck prn. - CBC with Differential/Platelet - Comprehensive metabolic panel - TSH - Lipid panel - lisinopril (ZESTRIL) 5 MG tablet; Take 1 tablet (5 mg total) by mouth daily.  Dispense: 90 tablet; Refill: 4  2. Pure hypercholesterolemia Attempts to work on low fat diet. Walking a lot for exercise. Recheck labs. - CBC with Differential/Platelet - Comprehensive metabolic panel - TSH - Lipid panel  3. Tobacco use Still smoking approximately a pack per day. No significant cough, hemoptysis or sputum recently.  4. Screening mammogram for breast cancer - MM Digital Screening  5. Need for COVID vaccine - Pfizer SARS-COV-2 Vaccine  No follow-ups on file.      Haywood Pao, PA, have reviewed all documentation for this visit. The documentation on 02/17/20 for the  exam, diagnosis, procedures, and orders are all accurate and complete.    Dortha Kern, PA  [facility] [phone] (phone) [phone] (fax)  [facility]

## 2020-02-18 DIAGNOSIS — E7800 Pure hypercholesterolemia, unspecified: Secondary | ICD-10-CM | POA: Diagnosis not present

## 2020-02-18 DIAGNOSIS — I10 Essential (primary) hypertension: Secondary | ICD-10-CM | POA: Diagnosis not present

## 2020-02-19 LAB — CBC WITH DIFFERENTIAL/PLATELET
Basophils Absolute: 0.1 10*3/uL (ref 0.0–0.2)
Basos: 1 %
EOS (ABSOLUTE): 0.1 10*3/uL (ref 0.0–0.4)
Eos: 1 %
Hematocrit: 40.1 % (ref 34.0–46.6)
Hemoglobin: 14.2 g/dL (ref 11.1–15.9)
Immature Grans (Abs): 0 10*3/uL (ref 0.0–0.1)
Immature Granulocytes: 0 %
Lymphocytes Absolute: 2.5 10*3/uL (ref 0.7–3.1)
Lymphs: 33 %
MCH: 33.8 pg — ABNORMAL HIGH (ref 26.6–33.0)
MCHC: 35.4 g/dL (ref 31.5–35.7)
MCV: 96 fL (ref 79–97)
Monocytes Absolute: 0.7 10*3/uL (ref 0.1–0.9)
Monocytes: 9 %
Neutrophils Absolute: 4.1 10*3/uL (ref 1.4–7.0)
Neutrophils: 56 %
Platelets: 174 10*3/uL (ref 150–450)
RBC: 4.2 x10E6/uL (ref 3.77–5.28)
RDW: 13.1 % (ref 11.7–15.4)
WBC: 7.6 10*3/uL (ref 3.4–10.8)

## 2020-02-19 LAB — COMPREHENSIVE METABOLIC PANEL
ALT: 11 IU/L (ref 0–32)
AST: 16 IU/L (ref 0–40)
Albumin/Globulin Ratio: 1.9 (ref 1.2–2.2)
Albumin: 4.3 g/dL (ref 3.8–4.8)
Alkaline Phosphatase: 63 IU/L (ref 44–121)
BUN/Creatinine Ratio: 14 (ref 12–28)
BUN: 10 mg/dL (ref 8–27)
Bilirubin Total: 0.4 mg/dL (ref 0.0–1.2)
CO2: 23 mmol/L (ref 20–29)
Calcium: 9.3 mg/dL (ref 8.7–10.3)
Chloride: 102 mmol/L (ref 96–106)
Creatinine, Ser: 0.73 mg/dL (ref 0.57–1.00)
GFR calc Af Amer: 98 mL/min/{1.73_m2} (ref >59)
GFR calc non Af Amer: 85 mL/min/{1.73_m2} (ref >59)
Globulin, Total: 2.3 g/dL (ref 1.5–4.5)
Glucose: 86 mg/dL (ref 65–99)
Potassium: 4.6 mmol/L (ref 3.5–5.2)
Sodium: 137 mmol/L (ref 134–144)
Total Protein: 6.6 g/dL (ref 6.0–8.5)

## 2020-02-19 LAB — LIPID PANEL
Chol/HDL Ratio: 3.1 ratio (ref 0.0–4.4)
Cholesterol, Total: 256 mg/dL — ABNORMAL HIGH (ref 100–199)
HDL: 82 mg/dL (ref >39)
LDL Chol Calc (NIH): 160 mg/dL — ABNORMAL HIGH (ref 0–99)
Triglycerides: 83 mg/dL (ref 0–149)
VLDL Cholesterol Cal: 14 mg/dL (ref 5–40)

## 2020-02-19 LAB — TSH: TSH: 1.45 u[IU]/mL (ref 0.450–4.500)

## 2020-03-02 ENCOUNTER — Other Ambulatory Visit: Payer: Self-pay | Admitting: Family Medicine

## 2020-03-02 DIAGNOSIS — Z1231 Encounter for screening mammogram for malignant neoplasm of breast: Secondary | ICD-10-CM

## 2020-03-08 ENCOUNTER — Other Ambulatory Visit: Payer: Self-pay

## 2020-03-08 ENCOUNTER — Ambulatory Visit
Admission: RE | Admit: 2020-03-08 | Discharge: 2020-03-08 | Disposition: A | Discharge status: Home / Self Care | Payer: Medicare HMO | Source: Ambulatory Visit | Attending: Family Medicine | Admitting: Family Medicine

## 2020-03-08 DIAGNOSIS — Z1231 Encounter for screening mammogram for malignant neoplasm of breast: Secondary | ICD-10-CM | POA: Diagnosis not present

## 2020-04-02 ENCOUNTER — Telehealth: Payer: Self-pay | Admitting: Family Medicine

## 2020-04-02 NOTE — Telephone Encounter (Signed)
Copied from [place] [phone]. Topic: Medicare AWV >> Apr 02, 2020 11:23 AM Cher Nakai R wrote: Reason for CRM:   Left message for patient to call back and schedule Medicare Annual Wellness Visit (AWV) in office.   If not able to come in office, please offer to do virtually or by telephone.   Last AWV 03/20/2019  Please schedule at anytime with [facility] Health Advisor.  If any questions, please contact me at [phone]

## 2020-04-07 ENCOUNTER — Telehealth: Payer: Self-pay | Admitting: Family Medicine

## 2020-04-07 NOTE — Telephone Encounter (Signed)
Copied from [place] [phone]. Topic: Medicare AWV >> Apr 07, 2020  1:49 PM Cher Nakai R wrote: Reason for CRM:   No answer unable to leave a message for patient to call back and schedule Medicare Annual Wellness Visit (AWV) in office.   If not able to come in office, please offer to do virtually or by telephone.   Last AWV 03/20/2019  Please schedule at anytime with [facility] Health Advisor.  If any questions, please contact me at [phone]

## 2021-03-16 DIAGNOSIS — R42 Dizziness and giddiness: Secondary | ICD-10-CM | POA: Diagnosis not present

## 2021-03-16 DIAGNOSIS — R5383 Other fatigue: Secondary | ICD-10-CM | POA: Diagnosis not present

## 2021-03-16 DIAGNOSIS — R030 Elevated blood-pressure reading, without diagnosis of hypertension: Secondary | ICD-10-CM | POA: Diagnosis not present

## 2021-04-08 ENCOUNTER — Encounter: Payer: Medicare HMO | Admitting: Family Medicine

## 2021-04-12 ENCOUNTER — Ambulatory Visit (INDEPENDENT_AMBULATORY_CARE_PROVIDER_SITE_OTHER): Payer: Medicare HMO | Admitting: Family Medicine

## 2021-04-12 ENCOUNTER — Encounter: Payer: Self-pay | Admitting: Family Medicine

## 2021-04-12 ENCOUNTER — Other Ambulatory Visit: Payer: Self-pay

## 2021-04-12 VITALS — BP 142/73 | HR 69 | Temp 98.3°F | Resp 15 | Ht 60.0 in | Wt 113.7 lb

## 2021-04-12 DIAGNOSIS — G8929 Other chronic pain: Secondary | ICD-10-CM | POA: Diagnosis not present

## 2021-04-12 DIAGNOSIS — D229 Melanocytic nevi, unspecified: Secondary | ICD-10-CM | POA: Insufficient documentation

## 2021-04-12 DIAGNOSIS — Z1231 Encounter for screening mammogram for malignant neoplasm of breast: Secondary | ICD-10-CM | POA: Diagnosis not present

## 2021-04-12 DIAGNOSIS — M79672 Pain in left foot: Secondary | ICD-10-CM | POA: Diagnosis not present

## 2021-04-12 DIAGNOSIS — M25562 Pain in left knee: Secondary | ICD-10-CM

## 2021-04-12 DIAGNOSIS — E7800 Pure hypercholesterolemia, unspecified: Secondary | ICD-10-CM | POA: Diagnosis not present

## 2021-04-12 DIAGNOSIS — K5909 Other constipation: Secondary | ICD-10-CM | POA: Insufficient documentation

## 2021-04-12 DIAGNOSIS — M2012 Hallux valgus (acquired), left foot: Secondary | ICD-10-CM | POA: Diagnosis not present

## 2021-04-12 DIAGNOSIS — J432 Centrilobular emphysema: Secondary | ICD-10-CM

## 2021-04-12 DIAGNOSIS — Z23 Encounter for immunization: Secondary | ICD-10-CM | POA: Insufficient documentation

## 2021-04-12 DIAGNOSIS — I10 Essential (primary) hypertension: Secondary | ICD-10-CM

## 2021-04-12 DIAGNOSIS — H43391 Other vitreous opacities, right eye: Secondary | ICD-10-CM | POA: Insufficient documentation

## 2021-04-12 DIAGNOSIS — M21612 Bunion of left foot: Secondary | ICD-10-CM | POA: Insufficient documentation

## 2021-04-12 DIAGNOSIS — Z13820 Encounter for screening for osteoporosis: Secondary | ICD-10-CM | POA: Insufficient documentation

## 2021-04-12 DIAGNOSIS — Z0000 Encounter for general adult medical examination without abnormal findings: Secondary | ICD-10-CM | POA: Insufficient documentation

## 2021-04-12 DIAGNOSIS — Z720 Tobacco use: Secondary | ICD-10-CM | POA: Diagnosis not present

## 2021-04-12 DIAGNOSIS — K5900 Constipation, unspecified: Secondary | ICD-10-CM

## 2021-04-12 MED ORDER — AMLODIPINE BESYLATE 5 MG PO TABS: 5.0000 mg | ORAL_TABLET | Freq: Every day | ORAL | 3 refills | Status: DC

## 2021-04-12 MED ORDER — LISINOPRIL 10 MG PO TABS: 10.0000 mg | ORAL_TABLET | Freq: Every day | ORAL | 3 refills | Status: DC

## 2021-04-12 NOTE — Assessment & Plan Note (Signed)
Second dose provided

## 2021-04-12 NOTE — Assessment & Plan Note (Signed)
Reports laxity in L knee joint- appears to be OA in nature Recommend strengthening of muscle surrounding joint  Ref to ortho if desired

## 2021-04-12 NOTE — Assessment & Plan Note (Signed)
Continues to smoke Denies desire to quit Denies need for inhalers Continued education provided on smoking cessation- low dose CT order to assist in screening

## 2021-04-12 NOTE — Assessment & Plan Note (Signed)
dexa screening

## 2021-04-12 NOTE — Assessment & Plan Note (Signed)
Intermittent Reports taking dulcolax if needed Encouraged to take miralax instead- and increase fiber with plants and water

## 2021-04-12 NOTE — Assessment & Plan Note (Signed)
AK and SK Denies hx of BCC and SCC Reports excess sun exposure

## 2021-04-12 NOTE — Assessment & Plan Note (Signed)
pna vax provided Does not wish to get shingles Educated on flu- refused Plans to schedule covid Continues to work p/t Encourage bp control <130/<80 Continue smoking cessation teaching Due for mammo and dexa Has appt scheduled for colon Out of window for PAP Things to do to keep yourself healthy  - Exercise at least 30-45 minutes a day, 3-4 days a week.  - Eat a low-fat diet with lots of fruits and vegetables, up to 7-9 servings per day.  - Seatbelts can save your life. Wear them always.  - Smoke detectors on every level of your home, check batteries every year.  - Eye Doctor - have an eye exam every 1-2 years  - Safe sex - if you may be exposed to STDs, use a condom.  - Alcohol -  If you drink, do it moderately, less than 2 drinks per day.  - [place]. Choose someone to speak for you if you are not able.  - Depression is common in our stressful world.If you're feeling down or losing interest in things you normally enjoy, please come in for a visit.  - Violence - If anyone is threatening or hurting you, please call immediately.

## 2021-04-12 NOTE — Progress Notes (Signed)
Annual Wellness Visit     Patient: Amanda Nelson, Female    DOB: Aug 14, 1950, 71 y.o.   MRN: [id number] Visit Date: 04/12/2021  Today's Provider: Jacky Kindle, FNP   Chief Complaint  Patient presents with   Medicare Wellness   Subjective      HPI  Amanda Nelson is a 71 y.o. female who presents today for her Annual Wellness Visit. She reports consuming a general diet. Home exercise routine includes walking. She generally feels well. She reports sleeping fairly well. She does not have additional problems to discuss today.   Medications: Outpatient Medications Prior to Visit  Medication Sig   [DISCONTINUED] lisinopril (ZESTRIL) 5 MG tablet Take 1 tablet (5 mg total) by mouth daily.   [DISCONTINUED] albuterol (PROVENTIL HFA;VENTOLIN HFA) 108 (90 BASE) MCG/ACT inhaler Inhale 2 puffs into the lungs every 6 (six) hours as needed for wheezing or shortness of breath.   [DISCONTINUED] mometasone-formoterol (DULERA) 100-5 MCG/ACT AERO Inhale 2 puffs into the lungs 2 (two) times daily. (Patient not taking: Reported on 09/06/2017)   [DISCONTINUED] naproxen sodium (ALEVE) 220 MG tablet Take 220 mg by mouth as needed.   [DISCONTINUED] omeprazole (PRILOSEC) 40 MG capsule Take 1 capsule (40 mg total) by mouth daily. (Patient not taking: Reported on 03/20/2019)   [DISCONTINUED] tiotropium (SPIRIVA) 18 MCG inhalation capsule Place 1 capsule (18 mcg total) into inhaler and inhale daily. (Patient not taking: Reported on 03/20/2019)   No facility-administered medications prior to visit.    No Known Allergies  Patient Care Team: Jacky Kindle, FNP as PCP - General (Family Medicine)  Review of Systems  Constitutional:  Positive for unexpected weight change.  HENT:  Positive for hearing loss.   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  Gastrointestinal:  Positive for constipation.  Musculoskeletal:  Positive for arthralgias.   Neurological:  Positive for light-headedness and numbness.       Objective    Vitals: BP (!) 142/73    Pulse 69    Temp 98.3 F (36.8 C) (Oral)    Resp 15    Ht 5' (1.524 m)    Wt 113 lb 11.2 oz (51.6 kg)    SpO2 100%    BMI 22.21 kg/m    Physical Exam Vitals and nursing note reviewed.  Constitutional:      General: She is awake. She is not in acute distress.    Appearance: Normal appearance. She is well-developed, well-groomed and normal weight. She is not ill-appearing, toxic-appearing or diaphoretic.  HENT:     Head: Normocephalic and atraumatic.     Jaw: There is normal jaw occlusion. No trismus, tenderness, swelling or pain on movement.     Right Ear: Tympanic membrane, ear canal and external ear normal. Decreased hearing noted. There is no impacted cerumen.     Left Ear: Hearing, tympanic membrane, ear canal and external ear normal. There is no impacted cerumen.     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 No congestion or rhinorrhea.     Right Turbinates: Not enlarged, swollen or pale.     Left Turbinates: Not enlarged, swollen or pale.     Right Sinus: No maxillary sinus tenderness or frontal sinus tenderness.     Left Sinus: No maxillary sinus tenderness or  frontal sinus tenderness.     Mouth/Throat:     Lips: Pink.     Mouth: Mucous membranes are moist. No injury.     Tongue: No lesions.     Pharynx: Oropharynx is clear. Uvula midline. No pharyngeal swelling, oropharyngeal exudate, posterior oropharyngeal erythema or uvula swelling.     Tonsils: No tonsillar exudate or tonsillar abscesses.  Eyes:     General: Lids are normal. Lids are everted, no foreign bodies appreciated. Vision grossly intact. Gaze aligned appropriately. No allergic shiner or visual field deficit.       Right eye: No discharge.        Left eye: No discharge.     Extraocular Movements: Extraocular  movements intact.     Conjunctiva/sclera: Conjunctivae normal.     Right eye: Right conjunctiva is not injected. No exudate.    Left eye: Left conjunctiva is not injected. No exudate.    Pupils: Pupils are equal, round, and reactive to light.     Comments: Report of R eye floater; improving Does not interfere with ADLs Denies migraines   Neck:     Thyroid: No thyroid mass, thyromegaly or thyroid tenderness.     Vascular: No carotid bruit.     Trachea: Trachea normal.  Cardiovascular:     Rate and Rhythm: Normal rate and regular rhythm.     Pulses: Normal pulses.          Carotid pulses are 2+ on the right side and 2+ on the left side.      Radial pulses are 2+ on the right side and 2+ on the left side.       Dorsalis pedis pulses are 2+ on the right side and 2+ on the left side.       Posterior tibial pulses are 2+ on the right side and 2+ on the left side.     Heart sounds: Normal heart sounds, S1 normal and S2 normal. No murmur heard.   No friction rub. No gallop.  Pulmonary:     Effort: Pulmonary effort is normal. No respiratory distress.     Breath sounds: Normal breath sounds and air entry. No stridor. No wheezing, rhonchi or rales.  Chest:     Chest wall: No tenderness.       Comments: Breasts: breasts appear normal, no suspicious masses, no skin or nipple changes or axillary nodes, symmetric fibrous changes in both upper outer quadrants, right breast normal without mass, skin or nipple changes or axillary nodes, left breast normal without mass, skin or nipple changes or axillary nodes, risk and benefit of breast self-exam was discussed  2.5 cm irregular SK to edge of L breast; pt reported previous freezing attempts but returns, does not have derm currently denies bleeding Abdominal:     General: Abdomen is flat. Bowel sounds are normal. There is no distension.     Palpations: Abdomen is soft. There is no mass.     Tenderness: There is no abdominal tenderness. There is no  right CVA tenderness, left CVA tenderness, guarding or rebound.     Hernia: No hernia is present.  Genitourinary:    Comments: Exam deferred; denies complaints Musculoskeletal:        General: No swelling, tenderness, deformity or signs of injury. Normal range of motion.     Cervical back: Full passive range of motion without pain, normal range of motion and neck supple. No edema, rigidity or tenderness. No muscular tenderness.     Right  lower leg: No edema.     Left lower leg: No edema.     Left foot: Bunion present.       Feet:  Feet:     Comments: Reports pain in L foot d/t bunion Has appt with pod Has to wear stretchy shoes to accomodate  Lymphadenopathy:     Cervical: No cervical adenopathy.     Right cervical: No superficial, deep or posterior cervical adenopathy.    Left cervical: No superficial, deep or posterior cervical adenopathy.  Skin:    General: Skin is warm and dry.     Capillary Refill: Capillary refill takes less than 2 seconds.     Coloration: Skin is not jaundiced or pale.     Findings: No bruising, erythema, lesion or rash.       Neurological:     General: No focal deficit present.     Mental Status: She is alert and oriented to person, place, and time. Mental status is at baseline.     GCS: GCS eye subscore is 4. GCS verbal subscore is 5. GCS motor subscore is 6.     Sensory: Sensation is intact. No sensory deficit.     Motor: Motor function is intact. No weakness.     Coordination: Coordination is intact. Coordination normal.     Gait: Gait is intact. Gait normal.  Psychiatric:        Attention and Perception: Attention and perception normal.        Mood and Affect: Mood and affect normal.        Speech: Speech normal.        Behavior: Behavior normal. Behavior is cooperative.        Thought Content: Thought content normal.        Cognition and Memory: Cognition and memory normal.        Judgment: Judgment normal.    Most recent functional status  assessment: In your present state of health, do you have any difficulty performing the following activities: 04/12/2021  Hearing? Y  Vision? N  Difficulty concentrating or making decisions? N  Walking or climbing stairs? Y  Dressing or bathing? N  Doing errands, shopping? N  Some recent data might be hidden   Most recent fall risk assessment: Fall Risk  04/12/2021  Falls in the past year? 0  Number falls in past yr: 0  Injury with Fall? 0  Risk for fall due to : -  Follow up -    Most recent depression screenings: PHQ 2/9 Scores 04/12/2021 02/17/2020  PHQ - 2 Score 0 0  PHQ- 9 Score 0 -   Most recent cognitive screening: 6CIT Screen 08/24/2016  What Year? 0 points  What month? 0 points  What time? 0 points  Count back from 20 0 points  Months in reverse 0 points  Repeat phrase 10 points  Total Score 10   Most recent Audit-C alcohol use screening Alcohol Use Disorder Test (AUDIT) 02/17/2020  1. How often do you have a drink containing alcohol? 0  2. How many drinks containing alcohol do you have on a typical day when you are drinking? 0  3. How often do you have six or more drinks on one occasion? 0  AUDIT-C Score 0  Alcohol Brief Interventions/Follow-up AUDIT Score <7 follow-up not indicated   A score of 3 or more in women, and 4 or more in men indicates increased risk for alcohol abuse, EXCEPT if all of the points are from  question 1   No results found for any visits on 04/12/21.  Assessment & Plan     Annual wellness visit done today including the all of the following: Reviewed patient's Family Medical History Reviewed and updated list of patient's medical providers Assessment of cognitive impairment was done Assessed patient's functional ability Established a written schedule for health screening services Health Risk Assessent Completed and Reviewed  Exercise Activities and Dietary recommendations  Goals      DIET - INCREASE WATER INTAKE     Recommend  increasing water intake to 4-6 glasses a day.         Immunization History  Administered Date(s) Administered   PFIZER(Purple Top)SARS-COV-2 Vaccination 05/26/2019, 06/16/2019, 02/17/2020   PNEUMOCOCCAL CONJUGATE-20 04/12/2021   Pneumococcal Conjugate-13 09/05/2016   Tdap 09/05/2016    Health Maintenance  Topic Date Due   DEXA SCAN  07/15/2012   COVID-19 Vaccine (4 - Booster for Pfizer series) 04/28/2021 (Originally 04/13/2020)   Zoster Vaccines- Shingrix (1 of 2) 07/10/2021 (Originally 07/28/1970)   MAMMOGRAM  03/08/2022   COLONOSCOPY (Pts 45-37yrs Insurance coverage will need to be confirmed)  06/14/2025   TETANUS/TDAP  09/06/2026   Pneumonia Vaccine 64+ Years old  Completed   Hepatitis C Screening  Completed   HPV VACCINES  Aged Out   INFLUENZA VACCINE  Discontinued     Discussed health benefits of physical activity, and encouraged her to engage in regular exercise appropriate for her age and condition.    Problem List Items Addressed This Visit       Cardiovascular and Mediastinum   Essential hypertension    Chronic, unstable/elevated -recent ER/UC visit d/t blurry vision with dizziness; denies anxiety, started on norvasc Denies CP Denies SOB Denies DOE No LE Edema noted on exam Continue medications- ACEi and Norvasc; new dose of ACEi Refills provided Seek emergent care if you develop CP, chest pain or chest pressure       Relevant Medications   lisinopril (ZESTRIL) 10 MG tablet   amLODipine (NORVASC) 5 MG tablet   Other Relevant Orders   Comprehensive metabolic panel     Respiratory   COPD (chronic obstructive pulmonary disease) (HCC)    Continues to smoke Denies desire to quit Denies need for inhalers Continued education provided on smoking cessation- low dose CT order to assist in screening        Musculoskeletal and Integument   Multiple atypical nevi    AK and SK Denies hx of BCC and SCC Reports excess sun exposure      Relevant Orders    Ambulatory referral to Dermatology   Bunion of left foot    Has screening with pod accommodate with shoes        Other   Tobacco use    3/4 ppd      Relevant Orders   CT CHEST LUNG CA SCREEN LOW DOSE W/O CM   Pure hypercholesterolemia    Repeat lipids fasting      Relevant Medications   lisinopril (ZESTRIL) 10 MG tablet   amLODipine (NORVASC) 5 MG tablet   Other Relevant Orders   Lipid panel   Screening mammogram for breast cancer    Denies concerns; screening      Relevant Orders   MM 3D SCREEN BREAST BILATERAL   Screening for osteoporosis    dexa screening      Relevant Orders   DG Bone Density   Need for prophylactic vaccination with Streptococcus pneumoniae (Pneumococcus) and Influenza vaccines  Second dose provided      Relevant Orders   Pneumococcal conjugate vaccine 20-valent (Completed)   Chronic pain of left knee    Reports laxity in L knee joint- appears to be OA in nature Recommend strengthening of muscle surrounding joint  Ref to ortho if desired      Vitreous floaters of right eye    Improving; does not impair vision to affect ADLs Encouraged yearly vision screen      Constipation    Intermittent Reports taking dulcolax if needed Encouraged to take miralax instead- and increase fiber with plants and water      Encounter for subsequent annual wellness visit (AWV) in Medicare patient - Primary    pna vax provided Does not wish to get shingles Educated on flu- refused Plans to schedule covid Continues to work p/t Encourage bp control <130/<80 Continue smoking cessation teaching Due for mammo and dexa Has appt scheduled for colon Out of window for PAP Things to do to keep yourself healthy  - Exercise at least 30-45 minutes a day, 3-4 days a week.  - Eat a low-fat diet with lots of fruits and vegetables, up to 7-9 servings per day.  - Seatbelts can save your life. Wear them always.  - Smoke detectors on every level of your home, check  batteries every year.  - Eye Doctor - have an eye exam every 1-2 years  - Safe sex - if you may be exposed to STDs, use a condom.  - Alcohol -  If you drink, do it moderately, less than 2 drinks per day.  - Health Care Power of Attorney. Choose someone to speak for you if you are not able.  - Depression is common in our stressful world.If you're feeling down or losing interest in things you normally enjoy, please come in for a visit.  - Violence - If anyone is threatening or hurting you, please call immediately.          Return in about 4 weeks (around 05/10/2021) for nurse follow up- BP, bring home logs.     Leilani Merl, FNP, have reviewed all documentation for this visit. The documentation on 04/12/21 for the exam, diagnosis, procedures, and orders are all accurate and complete.  Patient seen and examined by Merita Norton,  FNP note scribed by Sheliah Hatch, NCMA   Jacky Kindle, FNP  [facility] [phone] (phone) [phone] (fax)  [facility]

## 2021-04-12 NOTE — Assessment & Plan Note (Signed)
Improving; does not impair vision to affect ADLs Encouraged yearly vision screen

## 2021-04-12 NOTE — Assessment & Plan Note (Signed)
Has screening with pod accommodate with shoes

## 2021-04-12 NOTE — Patient Instructions (Signed)
The 10-year ASCVD risk score (Arnett DK, et al., 2019) is: 22%   Values used to calculate the score:     Age: 71 years     Sex: Female     Is Non-Hispanic African American: No     Diabetic: No     Tobacco smoker: Yes     Systolic Blood Pressure: 848 mmHg     Is BP treated: Yes     HDL Cholesterol: 82 mg/dL     Total Cholesterol: 256 mg/dL   Things we can change -tobacco use -BP -cholesterol

## 2021-04-12 NOTE — Assessment & Plan Note (Signed)
3/4 ppd

## 2021-04-12 NOTE — Assessment & Plan Note (Signed)
Repeat lipids fasting

## 2021-04-12 NOTE — Assessment & Plan Note (Signed)
Chronic, unstable/elevated -recent ER/UC visit d/t blurry vision with dizziness; denies anxiety, started on norvasc Denies CP Denies SOB Denies DOE No LE Edema noted on exam Continue medications- ACEi and Norvasc; new dose of ACEi Refills provided Seek emergent care if you develop CP, chest pain or chest pressure

## 2021-04-12 NOTE — Assessment & Plan Note (Signed)
Denies concerns; screening

## 2021-04-13 ENCOUNTER — Telehealth: Payer: Self-pay

## 2021-04-13 ENCOUNTER — Other Ambulatory Visit: Payer: Self-pay | Admitting: Family Medicine

## 2021-04-13 LAB — LIPID PANEL
Chol/HDL Ratio: 2.9 ratio (ref 0.0–4.4)
Cholesterol, Total: 274 mg/dL — ABNORMAL HIGH (ref 100–199)
HDL: 94 mg/dL (ref >39)
LDL Chol Calc (NIH): 167 mg/dL — ABNORMAL HIGH (ref 0–99)
Triglycerides: 79 mg/dL (ref 0–149)
VLDL Cholesterol Cal: 13 mg/dL (ref 5–40)

## 2021-04-13 LAB — COMPREHENSIVE METABOLIC PANEL
ALT: 11 IU/L (ref 0–32)
AST: 15 IU/L (ref 0–40)
Albumin/Globulin Ratio: 2 (ref 1.2–2.2)
Albumin: 4.6 g/dL (ref 3.8–4.8)
Alkaline Phosphatase: 68 IU/L (ref 44–121)
BUN/Creatinine Ratio: 18 (ref 12–28)
BUN: 12 mg/dL (ref 8–27)
Bilirubin Total: 0.5 mg/dL (ref 0.0–1.2)
CO2: 22 mmol/L (ref 20–29)
Calcium: 9.7 mg/dL (ref 8.7–10.3)
Chloride: 102 mmol/L (ref 96–106)
Creatinine, Ser: 0.65 mg/dL (ref 0.57–1.00)
Globulin, Total: 2.3 g/dL (ref 1.5–4.5)
Glucose: 91 mg/dL (ref 70–99)
Potassium: 4.8 mmol/L (ref 3.5–5.2)
Sodium: 143 mmol/L (ref 134–144)
Total Protein: 6.9 g/dL (ref 6.0–8.5)
eGFR: 95 mL/min/{1.73_m2} (ref >59)

## 2021-04-13 MED ORDER — ROSUVASTATIN CALCIUM 20 MG PO TABS: 20.0000 mg | ORAL_TABLET | Freq: Every day | ORAL | 3 refills | Status: DC

## 2021-04-13 NOTE — Telephone Encounter (Signed)
Pt states message incorrect  States "If I need to be on a medication I would like to be put on a low dose statin because I already take CoQ10 and red yeast rice and have been told by cardiologist they do not work."

## 2021-04-13 NOTE — Telephone Encounter (Signed)
Patient had concerns of being on statin drug Mrs. Rollene Rotunda recommended the following "Recommend 600 mg dose of supplement CoQ10 enzyme to assist with prevention of muscle aches from statin." LMTCB okay for [facility] nurse triage to advise patient of message. KW

## 2021-04-13 NOTE — Progress Notes (Signed)
Hi Amanda Nelson,  I would recommend advance to statin at this time; cholesterol is slightly worse than it was 1 year ago. LDL is up as [place] total cholesterol. Triglycerides/fats and HDL have moved in favorable directions.  Normal kidney/liver function; blood chemistry.  Please let us know if you have any questions.  Thank you,  Tally Joe, FNP

## 2021-04-14 NOTE — Telephone Encounter (Signed)
See patient response below to your recommendation KW

## 2021-04-19 DIAGNOSIS — L821 Other seborrheic keratosis: Secondary | ICD-10-CM | POA: Diagnosis not present

## 2021-05-04 DIAGNOSIS — F17200 Nicotine dependence, unspecified, uncomplicated: Secondary | ICD-10-CM | POA: Diagnosis not present

## 2021-05-04 DIAGNOSIS — K5909 Other constipation: Secondary | ICD-10-CM | POA: Diagnosis not present

## 2021-05-04 DIAGNOSIS — Z800 Family history of malignant neoplasm of digestive organs: Secondary | ICD-10-CM | POA: Diagnosis not present

## 2021-05-08 DIAGNOSIS — Z800 Family history of malignant neoplasm of digestive organs: Secondary | ICD-10-CM | POA: Insufficient documentation

## 2021-05-08 DIAGNOSIS — F17200 Nicotine dependence, unspecified, uncomplicated: Secondary | ICD-10-CM | POA: Insufficient documentation

## 2021-05-10 ENCOUNTER — Other Ambulatory Visit: Payer: Self-pay

## 2021-05-10 ENCOUNTER — Encounter: Payer: Self-pay | Admitting: Family Medicine

## 2021-05-10 ENCOUNTER — Ambulatory Visit (INDEPENDENT_AMBULATORY_CARE_PROVIDER_SITE_OTHER): Payer: Medicare HMO | Admitting: Family Medicine

## 2021-05-10 VITALS — BP 120/67 | HR 68 | Temp 98.2°F | Resp 16 | Wt 113.8 lb

## 2021-05-10 DIAGNOSIS — R3989 Other symptoms and signs involving the genitourinary system: Secondary | ICD-10-CM | POA: Insufficient documentation

## 2021-05-10 DIAGNOSIS — R319 Hematuria, unspecified: Secondary | ICD-10-CM | POA: Diagnosis not present

## 2021-05-10 DIAGNOSIS — R350 Frequency of micturition: Secondary | ICD-10-CM | POA: Insufficient documentation

## 2021-05-10 DIAGNOSIS — I10 Essential (primary) hypertension: Secondary | ICD-10-CM

## 2021-05-10 DIAGNOSIS — F17210 Nicotine dependence, cigarettes, uncomplicated: Secondary | ICD-10-CM

## 2021-05-10 DIAGNOSIS — Z720 Tobacco use: Secondary | ICD-10-CM | POA: Diagnosis not present

## 2021-05-10 DIAGNOSIS — Z87891 Personal history of nicotine dependence: Secondary | ICD-10-CM

## 2021-05-10 LAB — POCT URINALYSIS DIPSTICK
Bilirubin, UA: NEGATIVE
Glucose, UA: NEGATIVE
Ketones, UA: NEGATIVE
Nitrite, UA: NEGATIVE
Protein, UA: NEGATIVE
Spec Grav, UA: 1.01 (ref 1.010–1.025)
Urobilinogen, UA: 0.2 E.U./dL
pH, UA: 6 (ref 5.0–8.0)

## 2021-05-10 NOTE — Assessment & Plan Note (Signed)
Chronic, does not wish to quit Smokes 3/4 ppd Has low dose chest ct scheduled later this spring Continue to reinforce slow reduction with goal of cessation

## 2021-05-10 NOTE — Assessment & Plan Note (Signed)
Chronic, stable on both 10mg  of lisinopril and 5mg  of norvasc Home numbers remains consistent with in office readings No further vision complaints or complaints of fizziness Denies CP Denies SOB Denies DOE No LE Edema noted on exam Continue medications- ACEi and Norvasc Refills stable Seek emergent care if you develop CP, chest pain or chest pressure

## 2021-05-10 NOTE — Assessment & Plan Note (Signed)
Negative CVA tenderness; complaints of bladder pressure and periods of urgency with increase frequency UA with leuks only Will send for culture Continue to recommend increased water and use of OTC AZO to assist

## 2021-05-10 NOTE — Progress Notes (Signed)
Established patient visit   Patient: Amanda Nelson   DOB: [birth year]   71 y.o. Female  MRN: [id number] Visit Date: 05/10/2021  Today's healthcare provider: Jacky Kindle, FNP   I,Joseline E Rosas,acting as a scribe for Jacky Kindle, FNP.,have documented all relevant documentation on the behalf of Jacky Kindle, FNP,as directed by  Jacky Kindle, FNP while in the presence of Jacky Kindle, FNP.   Chief Complaint  Patient presents with   Follow-up    HTN   Subjective    HPI HPI     Follow-up    Additional comments: HTN      Last edited by Marjie Skiff, CMA on 05/10/2021  2:02 PM.      Hypertension, follow-up  BP Readings from Last 3 Encounters:  05/10/21 120/67  04/12/21 (!) 142/73  02/17/20 (!) 158/55   Wt Readings from Last 3 Encounters:  05/10/21 113 lb 12.8 oz (51.6 kg)  04/12/21 113 lb 11.2 oz (51.6 kg)  02/17/20 118 lb (53.5 kg)     She was last seen for hypertension 1 months ago.  BP at that visit was 142/73. Management since that visit includes none.  Outside blood pressures are 110's-120's/60'-70's. Some are in the upper 130's-140's/70 Symptoms: No chest pain No chest pressure  No palpitations No syncope  No dyspnea No orthopnea  No paroxysmal nocturnal dyspnea No lower extremity edema   Pertinent labs: Lab Results  Component Value Date   CHOL 274 (H) 04/12/2021   HDL 94 04/12/2021   LDLCALC 167 (H) 04/12/2021   TRIG 79 04/12/2021   CHOLHDL 2.9 04/12/2021   Lab Results  Component Value Date   NA 143 04/12/2021   K 4.8 04/12/2021   CREATININE 0.65 04/12/2021   EGFR 95 04/12/2021   GLUCOSE 91 04/12/2021   TSH 1.450 02/18/2020     The 10-year ASCVD risk score (Arnett DK, et al., 2019) is: 16.1%   ---------------------------------------------------------------------------------------------------  Patient concern about pressure in bladder. She feel like urinating but when she goes nothing comes out. No painful or burning. No  hematuria.  Medications: Outpatient Medications Prior to Visit  Medication Sig   amLODipine (NORVASC) 5 MG tablet Take 1 tablet (5 mg total) by mouth daily.   lisinopril (ZESTRIL) 10 MG tablet Take 1 tablet (10 mg total) by mouth daily.   rosuvastatin (CRESTOR) 20 MG tablet Take 1 tablet (20 mg total) by mouth daily.   No facility-administered medications prior to visit.    Review of Systems     Objective    BP 120/67 (BP Location: Right Arm, Patient Position: Sitting, Cuff Size: Normal)    Pulse 68    Temp 98.2 F (36.8 C) (Temporal)    Resp 16    Wt 113 lb 12.8 oz (51.6 kg)    BMI 22.23 kg/m    Physical Exam Vitals and nursing note reviewed.  Constitutional:      General: She is not in acute distress.    Appearance: Normal appearance. She is normal weight. She is not ill-appearing, toxic-appearing or diaphoretic.  HENT:     Head: Normocephalic and atraumatic.  Cardiovascular:     Rate and Rhythm: Normal rate and regular rhythm.     Pulses: Normal pulses.     Heart sounds: Normal heart sounds. No murmur heard.   No friction rub. No gallop.  Pulmonary:     Effort: Pulmonary effort is normal. No respiratory distress.  Breath sounds: Normal breath sounds. No stridor. No wheezing, rhonchi or rales.  Chest:     Chest wall: No tenderness.  Abdominal:     General: Bowel sounds are normal.     Palpations: Abdomen is soft.  Musculoskeletal:        General: No swelling, tenderness, deformity or signs of injury. Normal range of motion.     Right lower leg: No edema.     Left lower leg: No edema.  Skin:    General: Skin is warm and dry.     Capillary Refill: Capillary refill takes less than 2 seconds.     Coloration: Skin is not jaundiced or pale.     Findings: No bruising, erythema, lesion or rash.  Neurological:     General: No focal deficit present.     Mental Status: She is alert and oriented to person, place, and time. Mental status is at baseline.     Cranial  Nerves: No cranial nerve deficit.     Sensory: No sensory deficit.     Motor: No weakness.     Coordination: Coordination normal.  Psychiatric:        Mood and Affect: Mood normal.        Behavior: Behavior normal.        Thought Content: Thought content normal.        Judgment: Judgment normal.     Results for orders placed or performed in visit on 05/10/21  POCT urinalysis dipstick  Result Value Ref Range   Color, UA Yellow    Clarity, UA Clear    Glucose, UA Negative Negative   Bilirubin, UA Negative    Ketones, UA Negative    Spec Grav, UA 1.010 1.010 - 1.025   Blood, UA Moderate    pH, UA 6.0 5.0 - 8.0   Protein, UA Negative Negative   Urobilinogen, UA 0.2 0.2 or 1.0 E.U./dL   Nitrite, UA Negative    Leukocytes, UA Moderate (2+) (A) Negative   Appearance     Odor      Assessment & Plan     Problem List Items Addressed This Visit       Cardiovascular and Mediastinum   Essential hypertension    Chronic, stable on both 10mg  of lisinopril and 5mg  of norvasc Home numbers remains consistent with in office readings No further vision complaints or complaints of fizziness Denies CP Denies SOB Denies DOE No LE Edema noted on exam Continue medications- ACEi and Norvasc Refills stable Seek emergent care if you develop CP, chest pain or chest pressure         Other   Hematuria    Patient complaint None seen on exam Will send for microscopic      Relevant Orders   Urine Culture   Urine Microscopic   Increased urinary frequency    Negative CVA tenderness; complaints of bladder pressure and periods of urgency with increase frequency UA with leuks only Will send for culture Continue to recommend increased water and use of OTC AZO to assist       Sensation of pressure in bladder area - Primary    Negative CVA tenderness; complaints of bladder pressure and periods of urgency with increase frequency UA with leuks only Will send for culture Continue to  recommend increased water and use of OTC AZO to assist      Relevant Orders   POCT urinalysis dipstick (Completed)   Urine Culture   Urine Microscopic  Tobacco use    Chronic, does not wish to quit Smokes 3/4 ppd Has low dose chest ct scheduled later this spring Continue to reinforce slow reduction with goal of cessation        Return in about 11 months (around 04/09/2022) for annual examination.      Leilani Merl, FNP, have reviewed all documentation for this visit. The documentation on 05/10/21 for the exam, diagnosis, procedures, and orders are all accurate and complete.    Jacky Kindle, FNP  [facility] [phone] (phone) [phone] (fax)  [facility]

## 2021-05-10 NOTE — Assessment & Plan Note (Signed)
Patient complaint None seen on exam Will send for microscopic

## 2021-05-12 LAB — URINALYSIS, MICROSCOPIC ONLY

## 2021-05-13 ENCOUNTER — Ambulatory Visit: Payer: Self-pay

## 2021-05-13 ENCOUNTER — Other Ambulatory Visit: Payer: Self-pay | Admitting: Family Medicine

## 2021-05-13 DIAGNOSIS — N3001 Acute cystitis with hematuria: Secondary | ICD-10-CM

## 2021-05-13 LAB — URINALYSIS, MICROSCOPIC ONLY
Bacteria, UA: NONE SEEN
Casts: NONE SEEN /lpf
Epithelial Cells (non renal): NONE SEEN /hpf (ref 0–10)
RBC, Urine: NONE SEEN /hpf (ref 0–2)

## 2021-05-13 LAB — URINE CULTURE

## 2021-05-13 LAB — SPECIMEN STATUS REPORT

## 2021-05-13 MED ORDER — SULFAMETHOXAZOLE-TRIMETHOPRIM 800-160 MG PO TABS: 1.0000 | ORAL_TABLET | Freq: Two times a day (BID) | ORAL | 0 refills | Status: DC

## 2021-05-13 NOTE — Telephone Encounter (Signed)
?  Chief Complaint: Wants lab results/ Follow up from [facility] on Monday ?Symptoms: Same as Monday. Urinary frequency and pressure ?Frequency: ongoing  ?Pertinent Negatives: Patient denies fever ?Disposition: [] ED /[] Urgent Care (no appt availability in office) / [] Appointment(In office/virtual)/ []  [facility]/ [] Home Care/ [] Refused Recommended Disposition /[] [facility]/ [x]  Follow-up with PCP ?Additional Notes: Pt questing call for labs results - treatment from Monday  ? ? ? ? ?Summary: poss uti  ? Pt called saying she was in the office and seen Tally Joe on Tuesday for frequent urination and pain when she urinates.  They took a culture but she has not heard back yet.  She is still having these symptoms.  She would like a nurse to call her back at [phone]   ?  ? ?Reason for Disposition ? Doctor (or NP/PA) call to PCP ? ?Answer Assessment - Initial Assessment Questions ?1. REASON FOR CALL or QUESTION: "What is your reason for calling today?" or "How can I best help you?" or "What question do you have that I can help answer?" ?    Pt looking for information regarding UA from Monday. Pt is still experiencing same S/S as Monday. Took Azo this morning for the first time. No relief yet. ? ?Protocols used: Information Only Call - No Triage-A-AH, PCP Call - No Triage-A-AH ? ?

## 2021-05-13 NOTE — Telephone Encounter (Signed)
Patient advised.

## 2021-05-24 ENCOUNTER — Ambulatory Visit
Admission: RE | Admit: 2021-05-24 | Discharge: 2021-05-24 | Disposition: A | Discharge status: Home / Self Care | Payer: Medicare HMO | Source: Ambulatory Visit | Attending: Family Medicine | Admitting: Family Medicine

## 2021-05-24 ENCOUNTER — Other Ambulatory Visit: Payer: Self-pay

## 2021-05-24 DIAGNOSIS — Z1231 Encounter for screening mammogram for malignant neoplasm of breast: Secondary | ICD-10-CM | POA: Diagnosis not present

## 2021-05-24 DIAGNOSIS — Z780 Asymptomatic menopausal state: Secondary | ICD-10-CM | POA: Insufficient documentation

## 2021-05-24 DIAGNOSIS — Z13820 Encounter for screening for osteoporosis: Secondary | ICD-10-CM | POA: Diagnosis not present

## 2021-05-24 DIAGNOSIS — M810 Age-related osteoporosis without current pathological fracture: Secondary | ICD-10-CM | POA: Diagnosis not present

## 2021-05-24 DIAGNOSIS — M85852 Other specified disorders of bone density and structure, left thigh: Secondary | ICD-10-CM | POA: Diagnosis not present

## 2021-05-24 DIAGNOSIS — J449 Chronic obstructive pulmonary disease, unspecified: Secondary | ICD-10-CM | POA: Insufficient documentation

## 2021-06-14 ENCOUNTER — Ambulatory Visit
Admission: RE | Admit: 2021-06-14 | Discharge: 2021-06-14 | Disposition: A | Discharge status: Home / Self Care | Payer: Medicare HMO | Source: Ambulatory Visit | Attending: Acute Care | Admitting: Acute Care

## 2021-06-14 ENCOUNTER — Ambulatory Visit (INDEPENDENT_AMBULATORY_CARE_PROVIDER_SITE_OTHER): Payer: Medicare HMO | Admitting: Acute Care

## 2021-06-14 ENCOUNTER — Encounter: Payer: 59 | Admitting: Acute Care

## 2021-06-14 ENCOUNTER — Encounter: Payer: Self-pay | Admitting: Acute Care

## 2021-06-14 ENCOUNTER — Other Ambulatory Visit: Payer: Self-pay

## 2021-06-14 DIAGNOSIS — Z87891 Personal history of nicotine dependence: Secondary | ICD-10-CM | POA: Insufficient documentation

## 2021-06-14 DIAGNOSIS — F17210 Nicotine dependence, cigarettes, uncomplicated: Secondary | ICD-10-CM

## 2021-06-14 NOTE — Patient Instructions (Signed)
Thank you for participating in the [facility]. It was our pleasure to meet you today. We will call you with the results of your scan within the next few days. Your scan will be assigned a Lung RADS category score by the physicians reading the scans.  This Lung RADS score determines follow up scanning.  See below for description of categories, and follow up screening recommendations. We will be in touch to schedule your follow up screening annually or based on recommendations of our providers. We will fax a copy of your scan results to your Primary Care Physician, or the physician who referred you to the program, to ensure they have the results. Please call the office if you have any questions or concerns regarding your scanning experience or results.  Our office number is [phone]. Please speak with Denise Phelps, RN. , or  Denise Buckner RN, They are  our Lung Cancer Screening RN.'s If They are unavailable when you call, Please leave a message on the voice mail. We will return your call at our earliest convenience.This voice mail is monitored several times a day.  Remember, if your scan is normal, we will scan you annually as long as you continue to meet the criteria for the program. (Age 55-77, Current smoker or smoker who has quit within the last 15 years). If you are a smoker, remember, quitting is the single most powerful action that you can take to decrease your risk of lung cancer and other pulmonary, breathing related problems. We know quitting is hard, and we are here to help.  Please let us know if there is anything we can do to help you meet your goal of quitting. If you are a former smoker, congratulations. We are proud of you! Remain smoke free! Remember you can refer friends or family members through the number above.  We will screen them to make sure they meet criteria for the program. Thank you for helping us take better care of you by  participating in Lung Screening.  You can receive free nicotine replacement therapy ( patches, gum or mints) by calling 1-800-QUIT NOW. Please call so we can get you on the path to becoming  a non-smoker. I know it is hard, but you can do this!  Lung RADS Categories:  Lung RADS 1: no nodules or definitely non-concerning nodules.  Recommendation is for a repeat annual scan in 12 months.  Lung RADS 2:  nodules that are non-concerning in appearance and behavior with a very low likelihood of becoming an active cancer. Recommendation is for a repeat annual scan in 12 months.  Lung RADS 3: nodules that are probably non-concerning , includes nodules with a low likelihood of becoming an active cancer.  Recommendation is for a 6-month repeat screening scan. Often noted after an upper respiratory illness. We will be in touch to make sure you have no questions, and to schedule your 6-month scan.  Lung RADS 4 A: nodules with concerning findings, recommendation is most often for a follow up scan in 3 months or additional testing based on our provider's assessment of the scan. We will be in touch to make sure you have no questions and to schedule the recommended 3 month follow up scan.  Lung RADS 4 B:  indicates findings that are concerning. We will be in touch with you to schedule additional diagnostic testing based on our provider's  assessment of the scan.  Other options for assistance in smoking cessation (   As covered by your insurance benefits)  Hypnosis for smoking cessation  [employer] [phone]  Acupuncture for smoking cessation  [facility] [phone]   

## 2021-06-14 NOTE — Progress Notes (Signed)
Virtual Visit via Telephone Note ? ?I connected with Amanda Nelson on 01/25/21 at  2:00 PM EST by telephone and verified that I am speaking with the correct person using two identifiers. ? ?Location: ?Patient: Home ?Provider: Working from home  ?  ?I discussed the limitations, risks, security and privacy concerns of performing an evaluation and management service by telephone and the availability of in person appointments. I also discussed with the patient that there may be a patient responsible charge related to this service. The patient expressed understanding and agreed to proceed. ? ?Shared Decision Making Visit Lung Cancer Screening Program ?([phone]) ? ? ?Eligibility: ?Age 71 y.o. ?Pack Years Smoking History Calculation 29 ?(# packs/per year x # years smoked) ?Recent History of coughing up blood  no ?Unexplained weight loss? no ?( >Than 15 pounds within the last 6 months ) ?Prior History Lung / other cancer no ?(Diagnosis within the last 5 years already requiring surveillance chest CT Scans). ?Smoking Status Current Smoker ?Former Smokers: Years since quit: NA ? Quit Date: NA ? ?Visit Components: ?Discussion included one or more decision making aids. yes ?Discussion included risk/benefits of screening. yes ?Discussion included potential follow up diagnostic testing for abnormal scans. yes ?Discussion included meaning and risk of over diagnosis. yes ?Discussion included meaning and risk of False Positives. yes ?Discussion included meaning of total radiation exposure. yes ? ?Counseling Included: ?Importance of adherence to annual lung cancer LDCT screening. yes ?Impact of comorbidities on ability to participate in the program. yes ?Ability and willingness to under diagnostic treatment. yes ? ?Smoking Cessation Counseling: ?Current Smokers:  ?Discussed importance of smoking cessation. yes ?Information about tobacco cessation classes and interventions provided to patient. yes ?Patient provided with "ticket"  for LDCT Scan. yes ?Symptomatic Patient. yes ? Counseling(Intermediate counseling: > three minutes) 99406 ?Diagnosis Code: Tobacco Use Z72.0 ?Asymptomatic Patient no ? Counseling NA ?Former Smokers:  ?Discussed the importance of maintaining cigarette abstinence. yes ?Diagnosis Code: Personal History of Nicotine Dependence. O53.664 ?Information about tobacco cessation classes and interventions provided to patient. Yes ?Patient provided with "ticket" for LDCT Scan. yes ?Written Order for Lung Cancer Screening with LDCT placed in Epic. Yes ?(CT Chest Lung Cancer Screening Low Dose W/O CM) [id number] ?Z12.2-Screening of respiratory organs ?Z87.891-Personal history of nicotine dependence ? ? ?I spent 25 minutes of face to face time with her discussing the risks and benefits of lung cancer screening. We viewed a power point together that explained in detail the above noted topics. We took the time to pause the power point at intervals to allow for questions to be asked and answered to ensure understanding. We discussed that she had taken the single most powerful action possible to decrease her risk of developing lung cancer when she quit smoking. I counseled her to remain smoke free, and to contact me if she ever had the desire to smoke again so that I can provide resources and tools to help support the effort to remain smoke free. We discussed the time and location of the scan, and that either  Doroteo Glassman RN or I will call with the results within  24-48 hours of receiving them. She has my card and contact information in the event she needs to speak with me, in addition to a copy of the power point we reviewed as a resource. She verbalized understanding of all of the above and had no further questions upon leaving the office.  ? ? ? ?I explained to the patient that there has been  a high incidence of coronary artery disease noted on these exams. I explained that this is a non-gated exam therefore degree or severity cannot  be determined. This patient is on statin therapy. I have asked the patient to follow-up with their PCP regarding any incidental finding of coronary artery disease and management with diet or medication as they feel is clinically indicated. The patient verbalized understanding of the above and had no further questions. ? ? ?I spent 3 minutes counseling on smoking cessation and the health risks of continued tobacco abuse  ? ? ?Daril Warga D. Harris, NP-C ?[facility] ?Personal contact information can be found on Amion  ?06/14/2021, 2:11 PM ? ? ? ? ? ? ? ? ? ?

## 2021-06-16 ENCOUNTER — Other Ambulatory Visit: Payer: Self-pay | Admitting: Acute Care

## 2021-06-16 DIAGNOSIS — Z87891 Personal history of nicotine dependence: Secondary | ICD-10-CM

## 2021-06-16 DIAGNOSIS — F17210 Nicotine dependence, cigarettes, uncomplicated: Secondary | ICD-10-CM

## 2021-06-16 DIAGNOSIS — Z122 Encounter for screening for malignant neoplasm of respiratory organs: Secondary | ICD-10-CM

## 2021-07-21 DIAGNOSIS — L570 Actinic keratosis: Secondary | ICD-10-CM | POA: Diagnosis not present

## 2021-07-21 DIAGNOSIS — L578 Other skin changes due to chronic exposure to nonionizing radiation: Secondary | ICD-10-CM | POA: Diagnosis not present

## 2021-07-21 DIAGNOSIS — L820 Inflamed seborrheic keratosis: Secondary | ICD-10-CM | POA: Diagnosis not present

## 2021-07-21 DIAGNOSIS — L821 Other seborrheic keratosis: Secondary | ICD-10-CM | POA: Diagnosis not present

## 2021-07-21 DIAGNOSIS — L720 Epidermal cyst: Secondary | ICD-10-CM | POA: Diagnosis not present

## 2021-07-21 DIAGNOSIS — D485 Neoplasm of uncertain behavior of skin: Secondary | ICD-10-CM | POA: Diagnosis not present

## 2021-09-22 DIAGNOSIS — K5730 Diverticulosis of large intestine without perforation or abscess without bleeding: Secondary | ICD-10-CM | POA: Diagnosis not present

## 2021-09-22 DIAGNOSIS — K640 First degree hemorrhoids: Secondary | ICD-10-CM | POA: Diagnosis not present

## 2021-09-22 DIAGNOSIS — Z800 Family history of malignant neoplasm of digestive organs: Secondary | ICD-10-CM | POA: Diagnosis not present

## 2021-09-22 DIAGNOSIS — Z1211 Encounter for screening for malignant neoplasm of colon: Secondary | ICD-10-CM | POA: Diagnosis not present

## 2022-02-14 ENCOUNTER — Other Ambulatory Visit: Payer: Self-pay | Admitting: Family Medicine

## 2022-02-14 DIAGNOSIS — I10 Essential (primary) hypertension: Secondary | ICD-10-CM

## 2022-02-14 NOTE — Telephone Encounter (Signed)
Requested Prescriptions  Pending Prescriptions Disp Refills   amLODipine (NORVASC) 5 MG tablet [Pharmacy Med Name: AMLODIPINE BESYLATE 5 MG Tablet] 90 tablet 3    Sig: TAKE 1 TABLET EVERY DAY     Cardiovascular: Calcium Channel Blockers 2 Failed - 02/14/2022  1:15 PM      Failed - Valid encounter within last 6 months    Recent Outpatient Visits           9 months ago Sensation of pressure in bladder area   [facility] Gwyneth Sprout, FNP   10 months ago Encounter for subsequent annual wellness visit (AWV) in Medicare patient   [facility] Tally Joe T, FNP   1 year ago Essential hypertension   [place], Vickki Muff, PA-C   4 years ago Chronic obstructive pulmonary disease, unspecified COPD type ([place])   [place], Vickki Muff, PA-C   5 years ago GYN exam for high-risk Medicare patient   [place], PA-C              Passed - Last BP in normal range    BP Readings from Last 1 Encounters:  05/10/21 120/67         Passed - Last Heart Rate in normal range    Pulse Readings from Last 1 Encounters:  05/10/21 68          lisinopril (ZESTRIL) 10 MG tablet [Pharmacy Med Name: LISINOPRIL 10 MG Tablet] 90 tablet 3    Sig: TAKE 1 TABLET EVERY DAY     Cardiovascular:  ACE Inhibitors Failed - 02/14/2022  1:15 PM      Failed - Cr in normal range and within 180 days    Creatinine  Date Value Ref Range Status  07/02/2014 0.64 mg/dL Final    Comment:    0.44-1.00 NOTE: New Reference Range  05/19/14    Creatinine, Ser  Date Value Ref Range Status  04/12/2021 0.65 0.57 - 1.00 mg/dL Final         Failed - K in normal range and within 180 days    Potassium  Date Value Ref Range Status  04/12/2021 4.8 3.5 - 5.2 mmol/L Final  07/02/2014 3.8 mmol/L Final    Comment:    3.5-5.1 NOTE: New Reference Range  05/19/14          Failed - Valid encounter within last 6  months    Recent Outpatient Visits           9 months ago Sensation of pressure in bladder area   [facility] Gwyneth Sprout, FNP   10 months ago Encounter for subsequent annual wellness visit (AWV) in Medicare patient   [facility] Gwyneth Sprout, FNP   1 year ago Essential hypertension   [employer], Vickki Muff, PA-C   4 years ago Chronic obstructive pulmonary disease, unspecified COPD type [facility])   [place], Vickki Muff, PA-C   5 years ago GYN exam for high-risk Medicare patient   [place], [place]              Passed - Patient is not pregnant      Passed - Last BP in normal range    BP Readings from Last 1 Encounters:  05/10/21 120/67          rosuvastatin (CRESTOR) 20 MG tablet [Pharmacy Med Name: ROSUVASTATIN CALCIUM 20  MG Tablet] 90 tablet 3    Sig: TAKE 1 TABLET EVERY DAY     Cardiovascular:  Antilipid - Statins 2 Failed - 02/14/2022  1:15 PM      Failed - Lipid Panel in normal range within the last 12 months    Cholesterol, Total  Date Value Ref Range Status  04/12/2021 274 (H) 100 - 199 mg/dL Final   LDL Chol Calc (NIH)  Date Value Ref Range Status  04/12/2021 167 (H) 0 - 99 mg/dL Final   HDL  Date Value Ref Range Status  04/12/2021 94 >39 mg/dL Final   Triglycerides  Date Value Ref Range Status  04/12/2021 79 0 - 149 mg/dL Final         Passed - Cr in normal range and within 360 days    Creatinine  Date Value Ref Range Status  07/02/2014 0.64 mg/dL Final    Comment:    0.44-1.00 NOTE: New Reference Range  05/19/14    Creatinine, Ser  Date Value Ref Range Status  04/12/2021 0.65 0.57 - 1.00 mg/dL Final         Passed - Patient is not pregnant      Passed - Valid encounter within last 12 months    Recent Outpatient Visits           9 months ago Sensation of pressure in bladder area   [facility] Gwyneth Sprout,  FNP   10 months ago Encounter for subsequent annual wellness visit (AWV) in Medicare patient   [facility] Tally Joe T, FNP   1 year ago Essential hypertension   [place], Vickki Muff, PA-C   4 years ago Chronic obstructive pulmonary disease, unspecified COPD type [facility])   [place], Vickki Muff, PA-C   5 years ago GYN exam for high-risk Medicare patient   [employer], Vickki Muff, [place]

## 2022-02-23 ENCOUNTER — Encounter: Payer: Self-pay | Admitting: Family Medicine

## 2022-02-23 ENCOUNTER — Ambulatory Visit (INDEPENDENT_AMBULATORY_CARE_PROVIDER_SITE_OTHER): Payer: Medicare HMO | Admitting: Family Medicine

## 2022-02-23 VITALS — BP 136/65 | HR 61 | Resp 16 | Ht 60.0 in | Wt 112.0 lb

## 2022-02-23 DIAGNOSIS — M75101 Unspecified rotator cuff tear or rupture of right shoulder, not specified as traumatic: Secondary | ICD-10-CM

## 2022-02-23 DIAGNOSIS — J432 Centrilobular emphysema: Secondary | ICD-10-CM

## 2022-02-23 DIAGNOSIS — E7800 Pure hypercholesterolemia, unspecified: Secondary | ICD-10-CM | POA: Diagnosis not present

## 2022-02-23 DIAGNOSIS — I10 Essential (primary) hypertension: Secondary | ICD-10-CM | POA: Diagnosis not present

## 2022-02-23 MED ORDER — LISINOPRIL 10 MG PO TABS: 10.0000 mg | ORAL_TABLET | Freq: Every day | ORAL | 3 refills | Status: DC

## 2022-02-23 MED ORDER — CYCLOBENZAPRINE HCL 10 MG PO TABS: 10.0000 mg | ORAL_TABLET | Freq: Every day | ORAL | 0 refills | Status: DC

## 2022-02-23 MED ORDER — AMLODIPINE BESYLATE 5 MG PO TABS: 5.0000 mg | ORAL_TABLET | Freq: Every day | ORAL | 3 refills | Status: DC

## 2022-02-23 MED ORDER — ROSUVASTATIN CALCIUM 20 MG PO TABS: 20.0000 mg | ORAL_TABLET | Freq: Every day | ORAL | 3 refills | Status: DC

## 2022-02-23 NOTE — Assessment & Plan Note (Signed)
Acute on chronic, wakes her from sleep Side lying sleep position Encouraged improved posture Trial of muscle relaxants to assist Plan to refer to PT/ortho for assistance if not improved

## 2022-02-23 NOTE — Assessment & Plan Note (Signed)
Chronic, stable  Repeat CBC and CMP

## 2022-02-23 NOTE — Assessment & Plan Note (Signed)
Chronic, stable No daily inhalers at this time per pt request Not ready to quit smoking- continues a 3/4 ppd

## 2022-02-23 NOTE — Assessment & Plan Note (Signed)
Chronic, previously elevated On Crestor 20

## 2022-02-23 NOTE — Progress Notes (Signed)
Established patient visit   Patient: Amanda Nelson   DOB: [birth year]   71 y.o. Female  MRN: [id number] Visit Date: 02/23/2022  Today's healthcare provider: Jacky Kindle, FNP  Re Introduced to nurse practitioner role and practice setting.  All questions answered.  Discussed provider/patient relationship and expectations.  I,Tiffany J Bragg,acting as a scribe for Jacky Kindle, FNP.,have documented all relevant documentation on the behalf of Jacky Kindle, FNP,as directed by  Jacky Kindle, FNP while in the presence of Jacky Kindle, FNP.  Chief Complaint  Patient presents with   Hypertension   Hyperlipidemia   Arm Pain    Patient states her R arm falls asleep at night and is painful and burning.    Subjective    HPI HPI     Arm Pain    Additional comments: Patient states her R arm falls asleep at night and is painful and burning.       Last edited by Marlana Salvage, CMA on 02/23/2022  1:06 PM.      Hypertension, follow-up  BP Readings from Last 3 Encounters:  02/23/22 136/65  05/10/21 120/67  04/12/21 (!) 142/73   Wt Readings from Last 3 Encounters:  02/23/22 112 lb (50.8 kg)  05/10/21 113 lb 12.8 oz (51.6 kg)  04/12/21 113 lb 11.2 oz (51.6 kg)     She was last seen for hypertension 10 months ago.  BP at that visit was 120/67. Management since that visit includes continue lisinopril 10mg  and norvasc 5mg . She reports excellent compliance with treatment. She is not having side effects.  She is not exercising. She is adherent to low salt diet.   Outside blood pressures are not checked.  She does smoke.  Use of agents associated with hypertension: none.   --------------------------------------------------------------------------------------------------- Lipid/Cholesterol, follow-up  Last Lipid Panel: Lab Results  Component Value Date   CHOL 274 (H) 04/12/2021   LDLCALC 167 (H) 04/12/2021   HDL 94 04/12/2021   TRIG 79 04/12/2021    She  was last seen for this 10 months ago.  Management since that visit includes continue crestor 20mg .  She reports excellent compliance with treatment. She is not having side effects.   Symptoms: No appetite changes No foot ulcerations  No chest pain No chest pressure/discomfort  No dyspnea No orthopnea  No fatigue No lower extremity edema  No palpitations No paroxysmal nocturnal dyspnea  No nausea Yes numbness or tingling of extremity  No polydipsia No polyuria  No speech difficulty No syncope   She is following a Low Sodium diet. Current exercise: none  Last metabolic panel Lab Results  Component Value Date   GLUCOSE 91 04/12/2021   NA 143 04/12/2021   K 4.8 04/12/2021   BUN 12 04/12/2021   CREATININE 0.65 04/12/2021   EGFR 95 04/12/2021   GFRNONAA 85 02/18/2020   CALCIUM 9.7 04/12/2021   AST 15 04/12/2021   ALT 11 04/12/2021   The 10-year ASCVD risk score (Arnett DK, et al., 2019) is: 22%  ---------------------------------------------------------------------------------------------------   Medications: Outpatient Medications Prior to Visit  Medication Sig   [DISCONTINUED] amLODipine (NORVASC) 5 MG tablet Take 1 tablet (5 mg total) by mouth daily.   [DISCONTINUED] lisinopril (ZESTRIL) 10 MG tablet Take 1 tablet (10 mg total) by mouth daily.   [DISCONTINUED] rosuvastatin (CRESTOR) 20 MG tablet Take 1 tablet (20 mg total) by mouth daily.   [DISCONTINUED] sulfamethoxazole-trimethoprim (BACTRIM DS) 800-160 MG tablet Take 1 tablet  by mouth 2 (two) times daily.   No facility-administered medications prior to visit.    Review of Systems    Objective    BP 136/65 (BP Location: Right Arm, Patient Position: Sitting, Cuff Size: Normal)   Pulse 61   Resp 16   Ht 5' (1.524 m)   Wt 112 lb (50.8 kg)   SpO2 100%   BMI 21.87 kg/m   Physical Exam Vitals and nursing note reviewed.  Constitutional:      General: She is not in acute distress.    Appearance: Normal  appearance. She is normal weight. She is not ill-appearing, toxic-appearing or diaphoretic.  HENT:     Head: Normocephalic and atraumatic.  Cardiovascular:     Rate and Rhythm: Normal rate and regular rhythm.     Pulses: Normal pulses.     Heart sounds: Normal heart sounds. No murmur heard.    No friction rub. No gallop.  Pulmonary:     Effort: Pulmonary effort is normal. No respiratory distress.     Breath sounds: Normal breath sounds. No stridor. No wheezing, rhonchi or rales.  Chest:     Chest wall: No tenderness.  Musculoskeletal:        General: Tenderness present. No swelling, deformity or signs of injury. Normal range of motion.       Arms:     Right lower leg: No edema.     Left lower leg: No edema.  Skin:    General: Skin is warm and dry.     Capillary Refill: Capillary refill takes less than 2 seconds.     Coloration: Skin is not jaundiced or pale.     Findings: No bruising, erythema, lesion or rash.  Neurological:     General: No focal deficit present.     Mental Status: She is alert and oriented to person, place, and time. Mental status is at baseline.     Cranial Nerves: No cranial nerve deficit.     Sensory: No sensory deficit.     Motor: No weakness.     Coordination: Coordination normal.  Psychiatric:        Mood and Affect: Mood normal.        Behavior: Behavior normal.        Thought Content: Thought content normal.        Judgment: Judgment normal.     No results found for any visits on 02/23/22.  Assessment & Plan     Problem List Items Addressed This Visit       Cardiovascular and Mediastinum   Essential hypertension    Chronic, stable  Repeat CBC and CMP      Relevant Medications   amLODipine (NORVASC) 5 MG tablet   lisinopril (ZESTRIL) 10 MG tablet   rosuvastatin (CRESTOR) 20 MG tablet   Other Relevant Orders   Basic Metabolic Panel (BMET)   CBC     Respiratory   Centrilobular emphysema (HCC) - Primary    Chronic, stable No daily  inhalers at this time per pt request Not ready to quit smoking- continues a 3/4 ppd      Relevant Orders   CBC     Musculoskeletal and Integument   Supraspinatus syndrome of right shoulder    Acute on chronic, wakes her from sleep Side lying sleep position Encouraged improved posture Trial of muscle relaxants to assist Plan to refer to PT/ortho for assistance if not improved       Relevant Medications   cyclobenzaprine (  FLEXERIL) 10 MG tablet     Other   Pure hypercholesterolemia    Chronic, previously elevated On Crestor 20      Relevant Medications   amLODipine (NORVASC) 5 MG tablet   lisinopril (ZESTRIL) 10 MG tablet   rosuvastatin (CRESTOR) 20 MG tablet   Other Relevant Orders   Lipid panel   Return in about 2 months (around 04/26/2022) for annual examination.     Leilani Merl, FNP, have reviewed all documentation for this visit. The documentation on 02/23/22 for the exam, diagnosis, procedures, and orders are all accurate and complete.  Jacky Kindle, FNP  [facility] [phone] (phone) [phone] (fax)  [facility]

## 2022-02-23 NOTE — Patient Instructions (Signed)
The CDC recommends two doses of Shingrix (the new shingles vaccine) separated by 2 to 6 months for adults age 70 years and older. I recommend checking with your insurance plan regarding coverage for this vaccine.    

## 2022-02-24 LAB — CBC
Hematocrit: 40.9 % (ref 34.0–46.6)
Hemoglobin: 13.9 g/dL (ref 11.1–15.9)
MCH: 33 pg (ref 26.6–33.0)
MCHC: 34 g/dL (ref 31.5–35.7)
MCV: 97 fL (ref 79–97)
Platelets: 186 10*3/uL (ref 150–450)
RBC: 4.21 x10E6/uL (ref 3.77–5.28)
RDW: 13 % (ref 11.7–15.4)
WBC: 8.5 10*3/uL (ref 3.4–10.8)

## 2022-02-24 LAB — BASIC METABOLIC PANEL
BUN/Creatinine Ratio: 19 (ref 12–28)
BUN: 11 mg/dL (ref 8–27)
CO2: 22 mmol/L (ref 20–29)
Calcium: 9.3 mg/dL (ref 8.7–10.3)
Chloride: 102 mmol/L (ref 96–106)
Creatinine, Ser: 0.58 mg/dL (ref 0.57–1.00)
Glucose: 84 mg/dL (ref 70–99)
Potassium: 4.1 mmol/L (ref 3.5–5.2)
Sodium: 137 mmol/L (ref 134–144)
eGFR: 97 mL/min/{1.73_m2} (ref >59)

## 2022-02-24 LAB — LIPID PANEL
Chol/HDL Ratio: 1.8 ratio (ref 0.0–4.4)
Cholesterol, Total: 175 mg/dL (ref 100–199)
HDL: 98 mg/dL (ref >39)
LDL Chol Calc (NIH): 64 mg/dL (ref 0–99)
Triglycerides: 67 mg/dL (ref 0–149)
VLDL Cholesterol Cal: 13 mg/dL (ref 5–40)

## 2022-02-24 NOTE — Progress Notes (Signed)
Cholesterol is much improved; The 10-year ASCVD risk score (Arnett DK, et al., 2019) is: 19.3%. This remains elevated with continued tobacco use; however, I am happy we started the crestor to assist with risk reduction of MI and/or CVA. recommend diet low in saturated fat and regular exercise - 30 min at least 5 times per week     Values used to calculate the score:     Age: 70 years     Sex: Female     Is Non-Hispanic African American: No     Diabetic: No     Tobacco smoker: Yes     Systolic Blood Pressure: 433 mmHg     Is BP treated: Yes     HDL Cholesterol: 98 mg/dL     Total Cholesterol: 175 mg/dL

## 2022-04-04 DIAGNOSIS — M2012 Hallux valgus (acquired), left foot: Secondary | ICD-10-CM | POA: Diagnosis not present

## 2022-04-06 ENCOUNTER — Other Ambulatory Visit: Payer: Self-pay | Admitting: Podiatry

## 2022-04-14 NOTE — Progress Notes (Unsigned)
I,Amanda Nelson R Tima Curet,acting as a Neurosurgeon for Amanda Kindle, FNP.,have documented all relevant documentation on the behalf of Amanda Kindle, FNP,as directed by  Amanda Kindle, FNP while in the presence of Amanda Kindle, FNP.  Annual Wellness Visit  Patient: Amanda Nelson, Female    DOB: August 18, 1950, 72 y.o.   MRN: [id number] Visit Date: 04/18/2022  Today's Provider: Jacky Kindle, FNP  Introduced to nurse practitioner role and practice setting.  All questions answered.  Discussed provider/patient relationship and expectations.  Chief Complaint  Patient presents with   Annual Exam   Subjective    Amanda Nelson is a 72 y.o. female who presents today for her Annual Wellness Visit. She reports consuming a general diet. The patient has a physically strenuous job, but has no regular exercise apart from work.  She generally feels fairly well. She reports sleeping poorly. She does not have additional problems to discuss today.   HPI  Patient declined shingles vaccine  Medications: Outpatient Medications Prior to Visit  Medication Sig   amLODipine (NORVASC) 5 MG tablet Take 1 tablet (5 mg total) by mouth daily.   lisinopril (ZESTRIL) 10 MG tablet Take 1 tablet (10 mg total) by mouth daily.   rosuvastatin (CRESTOR) 20 MG tablet Take 1 tablet (20 mg total) by mouth daily.   [DISCONTINUED] cyclobenzaprine (FLEXERIL) 10 MG tablet Take 1 tablet (10 mg total) by mouth at bedtime. (Patient not taking: Reported on 04/18/2022)   No facility-administered medications prior to visit.    No Known Allergies  Patient Care Team: Amanda Kindle, FNP as PCP - General (Family Medicine)  Review of Systems      Objective    Vitals: BP 136/68 (BP Location: Right Arm, Patient Position: Sitting, Cuff Size: Normal)   Pulse 67   Temp 97.7 F (36.5 C) (Oral)   Ht 5' (1.524 m)   Wt 114 lb 14.4 oz (52.1 kg)   SpO2 100%   BMI 22.44 kg/m     Physical Exam Vitals and nursing note reviewed.   Constitutional:      General: She is awake. She is not in acute distress.    Appearance: Normal appearance. She is well-developed, well-groomed and normal weight. She is not ill-appearing, toxic-appearing or diaphoretic.  HENT:     Head: Normocephalic and atraumatic.     Jaw: There is normal jaw occlusion. No trismus, tenderness, swelling or pain on movement.     Right Ear: Hearing, tympanic membrane, ear canal and external ear normal. There is no impacted cerumen.     Left Ear: Hearing, tympanic membrane, ear canal and external ear normal. There is no impacted cerumen.     Nose: Nose normal. No congestion or rhinorrhea.     Right Turbinates: Not enlarged, swollen or pale.     Left Turbinates: Not enlarged, swollen or pale.     Right Sinus: No maxillary sinus tenderness or frontal sinus tenderness.     Left Sinus: No maxillary sinus tenderness or frontal sinus tenderness.     Mouth/Throat:     Lips: Pink.     Mouth: Mucous membranes are moist. No injury.     Tongue: No lesions.     Pharynx: Oropharynx is clear. Uvula midline. No pharyngeal swelling, oropharyngeal exudate, posterior oropharyngeal erythema or uvula swelling.     Tonsils: No tonsillar exudate or tonsillar abscesses.  Eyes:     General: Lids are normal. Lids are everted, no foreign bodies appreciated. Vision  grossly intact. Gaze aligned appropriately. No allergic shiner or visual field deficit.       Right eye: No discharge.        Left eye: No discharge.     Extraocular Movements: Extraocular movements intact.     Conjunctiva/sclera: Conjunctivae normal.     Right eye: Right conjunctiva is not injected. No exudate.    Left eye: Left conjunctiva is not injected. No exudate.    Pupils: Pupils are equal, round, and reactive to light.  Neck:     Thyroid: No thyroid mass, thyromegaly or thyroid tenderness.     Vascular: No carotid bruit.     Trachea: Trachea normal.  Cardiovascular:     Rate and Rhythm: Normal rate and  regular rhythm.     Pulses: Normal pulses.          Carotid pulses are 2+ on the right side and 2+ on the left side.      Radial pulses are 2+ on the right side and 2+ on the left side.       Dorsalis pedis pulses are 2+ on the right side and 2+ on the left side.       Posterior tibial pulses are 2+ on the right side and 2+ on the left side.     Heart sounds: Normal heart sounds, S1 normal and S2 normal. No murmur heard.    No friction rub. No gallop.  Pulmonary:     Effort: Pulmonary effort is normal. No respiratory distress.     Breath sounds: Normal breath sounds and air entry. No stridor. No wheezing, rhonchi or rales.  Chest:     Chest wall: No tenderness.  Abdominal:     General: Abdomen is flat. Bowel sounds are normal. There is no distension.     Palpations: Abdomen is soft. There is no mass.     Tenderness: There is no abdominal tenderness. There is no right CVA tenderness, left CVA tenderness, guarding or rebound.     Hernia: No hernia is present.  Genitourinary:    Comments: Exam deferred; denies complaints Musculoskeletal:        General: Tenderness present. No swelling, deformity or signs of injury. Normal range of motion.     Cervical back: Full passive range of motion without pain, normal range of motion and neck supple. No edema, rigidity or tenderness. No muscular tenderness.     Right lower leg: No edema.     Left lower leg: No edema.     Comments: Ongoing intermittent tenderness and pain from R neck/upper back down R arm. Not improved with use of NSAIDs/APAP and flexeril. Referral placed to specialist for further assistance.   Lymphadenopathy:     Cervical: No cervical adenopathy.     Right cervical: No superficial, deep or posterior cervical adenopathy.    Left cervical: No superficial, deep or posterior cervical adenopathy.  Skin:    General: Skin is warm and dry.     Capillary Refill: Capillary refill takes less than 2 seconds.     Coloration: Skin is not  jaundiced or pale.     Findings: No bruising, erythema, lesion or rash.  Neurological:     General: No focal deficit present.     Mental Status: She is alert and oriented to person, place, and time. Mental status is at baseline.     GCS: GCS eye subscore is 4. GCS verbal subscore is 5. GCS motor subscore is 6.     Sensory:  Sensation is intact. No sensory deficit.     Motor: Motor function is intact. No weakness.     Coordination: Coordination is intact. Coordination normal.     Gait: Gait is intact. Gait normal.  Psychiatric:        Attention and Perception: Attention and perception normal.        Mood and Affect: Mood and affect normal.        Speech: Speech normal.        Behavior: Behavior normal. Behavior is cooperative.        Thought Content: Thought content normal.        Cognition and Memory: Cognition and memory normal.        Judgment: Judgment normal.    Most recent functional status assessment:    02/23/2022    1:09 PM  In your present state of health, do you have any difficulty performing the following activities:  Hearing? 1  Vision? 0  Difficulty concentrating or making decisions? 0  Walking or climbing stairs? 0  Dressing or bathing? 0  Doing errands, shopping? 0   Most recent fall risk assessment:    02/23/2022    1:09 PM  Fall Risk   Falls in the past year? 0  Number falls in past yr: 0  Injury with Fall? 0  Risk for fall due to : No Fall Risks  Follow up Falls evaluation completed    Most recent depression screenings:    02/23/2022    1:09 PM 04/12/2021   11:48 AM  PHQ 2/9 Scores  PHQ - 2 Score 0 0  PHQ- 9 Score 3 0   Most recent cognitive screening:    08/24/2016    9:21 AM  6CIT Screen  What Year? 0 points  What month? 0 points  What time? 0 points  Count back from 20 0 points  Months in reverse 0 points  Repeat phrase 10 points  Total Score 10 points   Most recent Audit-C alcohol use screening    02/23/2022    1:09 PM  Alcohol  Use Disorder Test (AUDIT)  1. How often do you have a drink containing alcohol? 0  2. How many drinks containing alcohol do you have on a typical day when you are drinking? 0  3. How often do you have six or more drinks on one occasion? 0  AUDIT-C Score 0   A score of 3 or more in women, and 4 or more in men indicates increased risk for alcohol abuse, EXCEPT if all of the points are from question 1   No results found for any visits on 04/18/22.  Assessment & Plan     Annual wellness visit done today including the all of the following: Reviewed patient's Family Medical History Reviewed and updated list of patient's medical providers Assessment of cognitive impairment was done Assessed patient's functional ability Established a written schedule for health screening services Health Risk Assessent Completed and Reviewed  Exercise Activities and Dietary recommendations  Goals      DIET - INCREASE WATER INTAKE     Recommend increasing water intake to 4-6 glasses a day.         Immunization History  Administered Date(s) Administered   PFIZER(Purple Top)SARS-COV-2 Vaccination 05/26/2019, 06/16/2019, 02/17/2020   PNEUMOCOCCAL CONJUGATE-20 04/12/2021   Pneumococcal Conjugate-13 09/05/2016   Tdap 09/05/2016    Health Maintenance  Topic Date Due   Zoster Vaccines- Shingrix (1 of 2) Never done   COVID-19 Vaccine (4 -  2023-24 season) 11/11/2021   Lung Cancer Screening  06/15/2022   Medicare Annual Wellness (AWV)  04/19/2023   MAMMOGRAM  05/25/2023   COLONOSCOPY (Pts 45-80yrs Insurance coverage will need to be confirmed)  06/14/2025   DEXA SCAN  05/25/2026   DTaP/Tdap/Td (2 - Td or Tdap) 09/06/2026   Pneumonia Vaccine 12+ Years old  Completed   Hepatitis C Screening  Completed   HPV VACCINES  Aged Out   INFLUENZA VACCINE  Discontinued     Discussed health benefits of physical activity, and encouraged her to engage in regular exercise appropriate for her age and condition.     Problem List Items Addressed This Visit       Respiratory   Chronic obstructive pulmonary disease (HCC)    Chronic, stable Currently working on tobacco reduction No adventitious lung sounds appreciated on exam.        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 Not improved with use of NSAIDs/APAP and flexeril. Referral placed to specialist for further assistance.       Relevant Orders   Ambulatory referral to Orthopedic Surgery   Ambulatory referral to Sports Medicine     Musculoskeletal and Integument   Supraspinatus syndrome of right shoulder    Failed use of NSAIDs/APAP and flexeril. Recommend specialist visit and plan for imaging per their request      Relevant Orders   Ambulatory referral to Orthopedic Surgery   Ambulatory referral to Sports Medicine     Other   Annual physical exam    Labs completed without concerns within past 3 months. Hold TSH screening Things to do to keep yourself healthy  - Exercise at least 30-45 minutes a day, 3-4 days a week.  - Eat a low-fat diet with lots of fruits and vegetables, up to 7-9 servings per day.  - Seatbelts can save your life. Wear them always.  - Smoke detectors on every level of your home, check batteries every year.  - Eye Doctor - have an eye exam every 1-2 years  - Safe sex - if you may be exposed to STDs, use a condom.  - Alcohol -  If you drink, do it moderately, less than 2 drinks per day.  - Health Care Power of Attorney. Choose someone to speak for you if you are not able.  - Depression is common in our stressful world.If you're feeling down or losing interest in things you normally enjoy, please come in for a visit.  - Violence - If anyone is threatening or hurting you, please call immediately.       Encounter for screening mammogram for malignant neoplasm of breast    Due  for screening for mammogram, denies breast concerns, provided with phone number to call and schedule appointment for mammogram. Encouraged to repeat breast cancer screening every 1-2 years.       Relevant Orders   MM 3D SCREEN BREAST BILATERAL   Encounter for subsequent annual wellness visit (AWV) in Medicare patient - Primary    Due for mammogram and lung cancer screening in next 2 months. Plans to have bunion removed. Working on tobacco reduction for upcoming procedure. Continues to have complaints from R neck/shoulder into R arm. Worse at night. Failed conservative management and stretching.      Relevant Orders   MM 3D SCREEN BREAST BILATERAL  Tobacco use    Chronic, improved with upcoming procedure. Unclear on number/day at this time with current reduction. Due for annual lung screening 06/2022.       Relevant Orders   Ambulatory Referral Lung Cancer Screening [facility]   Return in about 6 months (around 10/17/2022) for chonic disease management.    Leilani Merl, FNP, have reviewed all documentation for this visit. The documentation on 04/18/22 for the exam, diagnosis, procedures, and orders are all accurate and complete.  Amanda Kindle, FNP  [facility] [phone] (phone) [phone] (fax)  [facility]

## 2022-04-18 ENCOUNTER — Encounter: Payer: Self-pay | Admitting: Family Medicine

## 2022-04-18 ENCOUNTER — Encounter: Payer: Self-pay | Admitting: Podiatry

## 2022-04-18 ENCOUNTER — Ambulatory Visit (INDEPENDENT_AMBULATORY_CARE_PROVIDER_SITE_OTHER): Payer: Medicare HMO | Admitting: Family Medicine

## 2022-04-18 VITALS — BP 136/68 | HR 67 | Temp 97.7°F | Ht 60.0 in | Wt 114.9 lb

## 2022-04-18 DIAGNOSIS — M4802 Spinal stenosis, cervical region: Secondary | ICD-10-CM

## 2022-04-18 DIAGNOSIS — Z720 Tobacco use: Secondary | ICD-10-CM | POA: Diagnosis not present

## 2022-04-18 DIAGNOSIS — G992 Myelopathy in diseases classified elsewhere: Secondary | ICD-10-CM

## 2022-04-18 DIAGNOSIS — Z1231 Encounter for screening mammogram for malignant neoplasm of breast: Secondary | ICD-10-CM | POA: Insufficient documentation

## 2022-04-18 DIAGNOSIS — Z0000 Encounter for general adult medical examination without abnormal findings: Secondary | ICD-10-CM | POA: Diagnosis not present

## 2022-04-18 DIAGNOSIS — J449 Chronic obstructive pulmonary disease, unspecified: Secondary | ICD-10-CM | POA: Diagnosis not present

## 2022-04-18 DIAGNOSIS — M75101 Unspecified rotator cuff tear or rupture of right shoulder, not specified as traumatic: Secondary | ICD-10-CM

## 2022-04-18 NOTE — Assessment & Plan Note (Signed)
Labs completed without concerns within past 3 months. Hold TSH screening Things to do to keep yourself healthy  - Exercise at least 30-45 minutes a day, 3-4 days a week.  - Eat a low-fat diet with lots of fruits and vegetables, up to 7-9 servings per day.  - Seatbelts can save your life. Wear them always.  - Smoke detectors on every level of your home, check batteries every year.  - Eye Doctor - have an eye exam every 1-2 years  - Safe sex - if you may be exposed to STDs, use a condom.  - Alcohol -  If you drink, do it moderately, less than 2 drinks per day.  - [place]. Choose someone to speak for you if you are not able.  - Depression is common in our stressful world.If you're feeling down or losing interest in things you normally enjoy, please come in for a visit.  - Violence - If anyone is threatening or hurting you, please call immediately.

## 2022-04-18 NOTE — Patient Instructions (Signed)
The CDC recommends two doses of Shingrix (the new shingles vaccine) separated by 2 to 6 months for adults age 71 years and older. I recommend checking with your insurance plan regarding coverage for this vaccine.    Please call and schedule your mammogram:  [facility]  [address], [address] [facility] [place],  [place]  [id number] Get Driving Directions Main: [phone]  Sunday:Closed Monday:7:20 AM - 5:00 PM Tuesday:7:20 AM - 5:00 PM Wednesday:7:20 AM - 5:00 PM Thursday:7:20 AM - 5:00 PM Friday:7:20 AM - 4:30 PM Saturday:Closed  

## 2022-04-18 NOTE — Assessment & Plan Note (Signed)
Due for screening for mammogram, denies breast concerns, provided with phone number to call and schedule appointment for mammogram. Encouraged to repeat breast cancer screening every 1-2 years.  

## 2022-04-18 NOTE — Assessment & Plan Note (Signed)
Ongoing intermittent tenderness and pain from R neck/upper back down R arm. Not improved with use of NSAIDs/APAP and flexeril. Referral placed to specialist for further assistance.

## 2022-04-18 NOTE — Assessment & Plan Note (Signed)
Chronic, improved with upcoming procedure. Unclear on number/day at this time with current reduction. Due for annual lung screening 06/2022.

## 2022-04-18 NOTE — Assessment & Plan Note (Signed)
Due for mammogram and lung cancer screening in next 2 months. Plans to have bunion removed. Working on tobacco reduction for upcoming procedure. Continues to have complaints from R neck/shoulder into R arm. Worse at night. Failed conservative management and stretching.

## 2022-04-18 NOTE — Assessment & Plan Note (Signed)
Failed use of NSAIDs/APAP and flexeril. Recommend specialist visit and plan for imaging per their request

## 2022-04-18 NOTE — Assessment & Plan Note (Signed)
Chronic, stable Currently working on tobacco reduction No adventitious lung sounds appreciated on exam.

## 2022-04-19 NOTE — Progress Notes (Unsigned)
    Amanda Nelson D.[place] Phone: [phone]   Assessment and Plan:     There are no diagnoses linked to this encounter.  ***   Pertinent previous records reviewed include ***   Follow Up: ***     Subjective:   I, Amanda Nelson, am serving as a Education administrator for Amanda Nelson  Chief Complaint: right arm pain, spinal stenosis   HPI:   04/20/2022 Patient is a 72 year old female complaining of right arm pain. Patient states   Relevant Historical Information: ***  Additional pertinent review of systems negative.   Current Outpatient Medications:    amLODipine (NORVASC) 5 MG tablet, Take 1 tablet (5 mg total) by mouth daily., Disp: 90 tablet, Rfl: 3   lisinopril (ZESTRIL) 10 MG tablet, Take 1 tablet (10 mg total) by mouth daily., Disp: 90 tablet, Rfl: 3   rosuvastatin (CRESTOR) 20 MG tablet, Take 1 tablet (20 mg total) by mouth daily., Disp: 90 tablet, Rfl: 3   Objective:     There were no vitals filed for this visit.    There is no height or weight on file to calculate BMI.    Physical Exam:    ***   Electronically signed by:  Amanda Nelson D.[facility] 7:37 AM 04/19/22

## 2022-04-20 ENCOUNTER — Ambulatory Visit: Payer: Medicare HMO | Admitting: Sports Medicine

## 2022-04-20 ENCOUNTER — Ambulatory Visit (INDEPENDENT_AMBULATORY_CARE_PROVIDER_SITE_OTHER): Payer: Medicare HMO

## 2022-04-20 VITALS — BP 120/80 | HR 73 | Ht 60.0 in | Wt 114.0 lb

## 2022-04-20 DIAGNOSIS — M542 Cervicalgia: Secondary | ICD-10-CM | POA: Diagnosis not present

## 2022-04-20 DIAGNOSIS — M5030 Other cervical disc degeneration, unspecified cervical region: Secondary | ICD-10-CM

## 2022-04-20 DIAGNOSIS — M5010 Cervical disc disorder with radiculopathy, unspecified cervical region: Secondary | ICD-10-CM | POA: Diagnosis not present

## 2022-04-20 DIAGNOSIS — M47812 Spondylosis without myelopathy or radiculopathy, cervical region: Secondary | ICD-10-CM | POA: Diagnosis not present

## 2022-04-20 MED ORDER — GABAPENTIN 100 MG PO CAPS: 100.0000 mg | ORAL_CAPSULE | Freq: Every day | ORAL | 0 refills | Status: DC

## 2022-04-20 MED ORDER — MELOXICAM 15 MG PO TABS: 15.0000 mg | ORAL_TABLET | Freq: Every day | ORAL | 0 refills | Status: DC

## 2022-04-20 NOTE — Discharge Instructions (Signed)
[facility]  POST OPERATIVE INSTRUCTIONS FOR DR. FOWLER AND DR[facility]   Take your medication as prescribed.  Pain medication should be taken only as needed.  Keep the dressing clean, dry and intact.  Keep your foot elevated above the heart level for the first 48 hours.  Walking to the bathroom and brief periods of walking are acceptable, unless we have instructed you to be non-weight bearing.  Always wear your post-op shoe when walking.  Always use your crutches if you are to be non-weight bearing.  Do not take a shower. Baths are permissible as long as the foot is kept out of the water.   Every hour you are awake:  Bend your knee 15 times. Flex foot 15 times Massage calf 15 times  Call [facility] ([phone]) if any of the following problems occur: You develop a temperature or fever. The bandage becomes saturated with blood. Medication does not stop your pain. Injury of the foot occurs. Any symptoms of infection including redness, odor, or red streaks running from wound. 

## 2022-04-20 NOTE — Patient Instructions (Signed)
Good to see you - Start meloxicam 15 mg daily x2 weeks.  If still having pain after 2 weeks, complete 3rd-week of meloxicam. May use remaining meloxicam as needed once daily for pain control.  Do not to use additional NSAIDs while taking meloxicam.  May use Tylenol [id number] mg 2 to 3 times a day for breakthrough pain. Gabapentin 100 mg nightly  Neck HEP  Dont  start medication until after surgery and approval from surgeon  4 week follow up

## 2022-04-26 ENCOUNTER — Ambulatory Visit: Payer: Medicare HMO | Admitting: Anesthesiology

## 2022-04-26 ENCOUNTER — Encounter
Admission: RE | Disposition: A | Discharge status: Home / Self Care | Payer: Self-pay | Source: Home / Self Care | Attending: Podiatry

## 2022-04-26 ENCOUNTER — Other Ambulatory Visit: Payer: Self-pay

## 2022-04-26 ENCOUNTER — Ambulatory Visit
Admission: RE | Admit: 2022-04-26 | Discharge: 2022-04-26 | Disposition: A | Discharge status: Home / Self Care | Payer: Medicare HMO | Attending: Podiatry | Admitting: Podiatry

## 2022-04-26 ENCOUNTER — Encounter: Payer: Self-pay | Admitting: Podiatry

## 2022-04-26 DIAGNOSIS — M2012 Hallux valgus (acquired), left foot: Secondary | ICD-10-CM | POA: Diagnosis not present

## 2022-04-26 DIAGNOSIS — E785 Hyperlipidemia, unspecified: Secondary | ICD-10-CM | POA: Diagnosis not present

## 2022-04-26 DIAGNOSIS — M1990 Unspecified osteoarthritis, unspecified site: Secondary | ICD-10-CM | POA: Insufficient documentation

## 2022-04-26 DIAGNOSIS — F419 Anxiety disorder, unspecified: Secondary | ICD-10-CM | POA: Diagnosis not present

## 2022-04-26 DIAGNOSIS — F17210 Nicotine dependence, cigarettes, uncomplicated: Secondary | ICD-10-CM | POA: Insufficient documentation

## 2022-04-26 DIAGNOSIS — J449 Chronic obstructive pulmonary disease, unspecified: Secondary | ICD-10-CM | POA: Diagnosis not present

## 2022-04-26 DIAGNOSIS — I10 Essential (primary) hypertension: Secondary | ICD-10-CM | POA: Insufficient documentation

## 2022-04-26 DIAGNOSIS — F17200 Nicotine dependence, unspecified, uncomplicated: Secondary | ICD-10-CM | POA: Diagnosis not present

## 2022-04-26 HISTORY — DX: Unspecified hearing loss, unspecified ear: H91.90

## 2022-04-26 HISTORY — DX: Anesthesia of skin: R20.0

## 2022-04-26 HISTORY — PX: BUNIONECTOMY: SHX129

## 2022-04-26 HISTORY — DX: Presence of dental prosthetic device (complete) (partial): Z97.2

## 2022-04-26 SURGERY — BUNIONECTOMY
Anesthesia: General | Site: Toe | Laterality: Left

## 2022-04-26 MED ORDER — BUPIVACAINE HCL (PF) 0.25 % IJ SOLN
INTRAMUSCULAR | Status: DC | PRN
  Administered 2022-04-26: 10 mL

## 2022-04-26 MED ORDER — SODIUM CHLORIDE 0.9 % IR SOLN
Status: DC | PRN
  Administered 2022-04-26: 200 mL

## 2022-04-26 MED ORDER — ONDANSETRON HCL 4 MG/2ML IJ SOLN
INTRAMUSCULAR | Status: DC | PRN
  Administered 2022-04-26: 4 mg via INTRAVENOUS

## 2022-04-26 MED ORDER — CEFAZOLIN SODIUM-DEXTROSE 2-4 GM/100ML-% IV SOLN
2.0000 g | INTRAVENOUS | Status: AC
  Administered 2022-04-26: 2 g via INTRAVENOUS

## 2022-04-26 MED ORDER — BUPIVACAINE LIPOSOME 1.3 % IJ SUSP
INTRAMUSCULAR | Status: DC | PRN
  Administered 2022-04-26: 10 mL

## 2022-04-26 MED ORDER — FENTANYL CITRATE (PF) 100 MCG/2ML IJ SOLN
INTRAMUSCULAR | Status: DC | PRN
  Administered 2022-04-26 (×4): 25 ug via INTRAVENOUS

## 2022-04-26 MED ORDER — LACTATED RINGERS IV SOLN: INTRAVENOUS | Status: DC

## 2022-04-26 MED ORDER — EPHEDRINE SULFATE (PRESSORS) 50 MG/ML IJ SOLN
INTRAMUSCULAR | Status: DC | PRN
  Administered 2022-04-26: 2.5 mg via INTRAVENOUS
  Administered 2022-04-26: 7.5 mg via INTRAVENOUS

## 2022-04-26 MED ORDER — MIDAZOLAM HCL 5 MG/5ML IJ SOLN
INTRAMUSCULAR | Status: DC | PRN
  Administered 2022-04-26: 2 mg via INTRAVENOUS

## 2022-04-26 MED ORDER — PROPOFOL 10 MG/ML IV BOLUS
INTRAVENOUS | Status: DC | PRN
  Administered 2022-04-26: 40 mg via INTRAVENOUS
  Administered 2022-04-26: 60 mg via INTRAVENOUS

## 2022-04-26 MED ORDER — DEXAMETHASONE SODIUM PHOSPHATE 4 MG/ML IJ SOLN
INTRAMUSCULAR | Status: DC | PRN
  Administered 2022-04-26: 4 mg via INTRAVENOUS

## 2022-04-26 SURGICAL SUPPLY — 51 items
APL SKNCLS STERI-STRIP NONHPOA (GAUZE/BANDAGES/DRESSINGS) ×1
BENZOIN TINCTURE PRP APPL 2/3 (GAUZE/BANDAGES/DRESSINGS) ×1 IMPLANT
BLADE SAW LAPIPLASTY 40X11 (BLADE) IMPLANT
BLADE SURG 15 STRL LF DISP TIS (BLADE) IMPLANT
BLADE SURG 15 STRL SS (BLADE)
BNDG CMPR 5X4 CHSV STRCH STRL (GAUZE/BANDAGES/DRESSINGS) ×1
BNDG CMPR 75X41 PLY HI ABS (GAUZE/BANDAGES/DRESSINGS) ×1
BNDG COHESIVE 4X5 TAN STRL LF (GAUZE/BANDAGES/DRESSINGS) IMPLANT
BNDG ELASTIC 4X5.8 VLCR STR LF (GAUZE/BANDAGES/DRESSINGS) ×1 IMPLANT
BNDG ESMARCH 4 X 12 STRL LF (GAUZE/BANDAGES/DRESSINGS) ×1
BNDG ESMARCH 4X12 STRL LF (GAUZE/BANDAGES/DRESSINGS) IMPLANT
BNDG GAUZE DERMACEA FLUFF 4 (GAUZE/BANDAGES/DRESSINGS) IMPLANT
BNDG GZE DERMACEA 4 6PLY (GAUZE/BANDAGES/DRESSINGS) ×1
BNDG STRETCH 4X75 STRL LF (GAUZE/BANDAGES/DRESSINGS) ×1 IMPLANT
BOOT STEPPER DURA SM (SOFTGOODS) IMPLANT
CANISTER SUCT 1200ML W/VALVE (MISCELLANEOUS) ×1 IMPLANT
COVER LIGHT HANDLE UNIVERSAL (MISCELLANEOUS) ×2 IMPLANT
DRAPE FLUOR MINI C-ARM 54X84 (DRAPES) ×1 IMPLANT
DURAPREP 26ML APPLICATOR (WOUND CARE) ×1 IMPLANT
ELECT REM PT RETURN 9FT ADLT (ELECTROSURGICAL) ×1
ELECTRODE REM PT RTRN 9FT ADLT (ELECTROSURGICAL) ×1 IMPLANT
GAUZE SPONGE 4X4 12PLY STRL (GAUZE/BANDAGES/DRESSINGS) ×1 IMPLANT
GAUZE XEROFORM 1X8 LF (GAUZE/BANDAGES/DRESSINGS) ×1 IMPLANT
GLOVE SRG 8 PF TXTR STRL LF DI (GLOVE) ×2 IMPLANT
GLOVE SURG ENC MOIS LTX SZ7.5 (GLOVE) ×2 IMPLANT
GLOVE SURG UNDER POLY LF SZ8 (GLOVE) ×2
GOWN STRL REUS W/ TWL LRG LVL3 (GOWN DISPOSABLE) ×2 IMPLANT
GOWN STRL REUS W/TWL LRG LVL3 (GOWN DISPOSABLE) ×2
K-WIRE DBL END TROCAR 6X.045 (WIRE)
K-WIRE DBL END TROCAR 6X.062 (WIRE)
KIT TURNOVER KIT A (KITS) ×1 IMPLANT
KWIRE DBL END TROCAR 6X.045 (WIRE) IMPLANT
KWIRE DBL END TROCAR 6X.062 (WIRE) IMPLANT
LAPIPLASTY SYS 4A (Orthopedic Implant) ×1 IMPLANT
NS IRRIG 500ML POUR BTL (IV SOLUTION) ×1 IMPLANT
PACK EXTREMITY ARMC (MISCELLANEOUS) ×1 IMPLANT
RASP SM TEAR CROSS CUT (RASP) IMPLANT
SCREW 2.7 HIGH PITCH LOCKING (Screw) IMPLANT
SCREW HIGH PITCH LOCK 2.7 (Screw) IMPLANT
STOCKINETTE IMPERVIOUS LG (DRAPES) ×1 IMPLANT
STRIP CLOSURE SKIN 1/4X4 (GAUZE/BANDAGES/DRESSINGS) ×1 IMPLANT
SUT ETHILON 3-0 FS-10 30 BLK (SUTURE) ×1
SUT MNCRL 4-0 (SUTURE) ×1
SUT MNCRL 4-0 27XMFL (SUTURE) ×1
SUT VIC AB 3-0 SH 27 (SUTURE) ×1
SUT VIC AB 3-0 SH 27X BRD (SUTURE) IMPLANT
SUT VIC AB 4-0 SH 27 (SUTURE) ×1
SUT VIC AB 4-0 SH 27XANBCTRL (SUTURE) IMPLANT
SUTURE EHLN 3-0 FS-10 30 BLK (SUTURE) IMPLANT
SUTURE MNCRL 4-0 27XMF (SUTURE) IMPLANT
SYSTEM LAPIPLASTY 4A (Orthopedic Implant) IMPLANT

## 2022-04-26 NOTE — Op Note (Signed)
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  Local consisted of a one-to-one mixture of 0.25% plain bupivacaine and Exparel long-acting anesthetic.  A total of 20 cc was used    Hemostasis: [place]f tourniquet inflated to 200 mmHg for 60 minutes    Specimen: None    Complications: None    Operative indications:Amanda Nelson is an 72 y.o. that presents today for surgical intervention.  The risks/benefits/alternatives/complications have been discussed and consent has been given.    Procedure:  Patient was brought into the OR and placed on the operating table in thesupine position. After anesthesia was obtained theleft lower extremity was prepped and draped in usual sterile fashion.  Attention was directed to the dorsal aspect of the foot where a dorsal incision was made at the first met cuneiforms joint.  Sharp and blunt dissection was carried down to the periosteum.  Subperiosteal dissection was then undertaken.  This exposed the first met cuneiform joint.  This was then freed and loosened.  A small fulcrum was placed between the base of the first metatarsal and second metatarsal.  Next the joint positioner was placed on the medial aspect of the metatarsal.  A small stab incision was made at the second metatarsal.  Compression was placed for the positioner.  Good realignment of the first intermetatarsal angle was noted at this time.  Attention was then directed to the dorsomedial first MTPJ where an incision was performed.  Sharp and blunt dissection was carried down to the capsule.  The intermetatarsal space was then entered.  The conjoined tendon of the abductor was then freed from the base of the proximal phalanx.  Attention was to redirected to the first met cuneiform joint.  At  this time the osteotomy cut guide was placed into the joint region.  2 vertical cuts were then made.  The cartilage material was removed from the first met cuneiform joint and the joint was then prepped with a 2.0 mm drill bit.  The joint compressor was then placed.  Good compression and realignment was noted.  Next the medial and dorsal locking plates were then placed from the Golden set.  Realignment and stability was noted in all planes.  Attention was redirected to the dorsomedial first MTPJ.  A small T capsulotomy was performed.  The dorsomedial eminence was noted and transected and smoothed with a power rasp.  A small capsulorrhaphy was performed.  Closure was then performed after all areas were irrigated.  3-0 Vicryl for the capsular tissue.  4-0 Vicryl in subcutaneous tissue and a 4-0 Monocryl for the skin.     Patient tolerated the procedure and anesthesia well.  Was transported from the OR to the PACU with all vital signs stable and vascular status intact. To be discharged per routine protocol.  Will follow up in approximately 1 week in the outpatient clinic.

## 2022-04-26 NOTE — Anesthesia Postprocedure Evaluation (Signed)
Anesthesia Post Note  Patient: Amanda Nelson  Procedure(s) Performed: Maryclare Bean (Left: Toe)  Patient location during evaluation: PACU Anesthesia Type: General Level of consciousness: awake and alert Pain management: pain level controlled Vital Signs Assessment: post-procedure vital signs reviewed and stable Respiratory status: spontaneous breathing, nonlabored ventilation, respiratory function stable and patient connected to nasal cannula oxygen Cardiovascular status: blood pressure returned to baseline and stable Postop Assessment: no apparent nausea or vomiting Anesthetic complications: no   No notable events documented.   Last Vitals:  Vitals:   04/26/22 1331 04/26/22 1354  BP: 112/73   Pulse: (!) 25   Resp: 12   Temp: (!) 36.4 C 36.6 C  SpO2: 100%     Last Pain:  Vitals:   04/26/22 1354  TempSrc:   PainSc: 0-No pain                 Martha Clan

## 2022-04-26 NOTE — H&P (Signed)
HISTORY AND PHYSICAL INTERVAL NOTE:  04/26/2022  11:55 AM  Amanda Nelson  has presented today for surgery, with the diagnosis of M20.12 - Hallux valgus of left foot.  The various methods of treatment have been discussed with the patient.  No guarantees were given.  After consideration of risks, benefits and other options for treatment, the patient has consented to surgery.  I have reviewed the patients' chart and labs.     A history and physical examination was performed in my office.  The patient was reexamined.  There have been no changes to this history and physical examination.  Samara Deist A

## 2022-04-26 NOTE — Anesthesia Preprocedure Evaluation (Signed)
Anesthesia Evaluation  Patient identified by MRN, date of birth, ID band Patient awake    Reviewed: Allergy & Precautions, H&P , NPO status , Patient's Chart, lab work & pertinent test results, reviewed documented beta blocker date and time   History of Anesthesia Complications Negative for: history of anesthetic complications  Airway Mallampati: II  TM Distance: >3 FB Neck ROM: full    Dental  (+) Dental Advidsory Given, Edentulous Upper, Poor Dentition, Missing   Pulmonary neg shortness of breath, neg sleep apnea, COPD, neg recent URI, Current Smoker and Patient abstained from smoking.   Pulmonary exam normal breath sounds clear to auscultation       Cardiovascular Exercise Tolerance: Good hypertension, (-) angina (-) Cardiac Stents Normal cardiovascular exam(-) dysrhythmias (-) Valvular Problems/Murmurs Rhythm:regular Rate:Normal     Neuro/Psych  PSYCHIATRIC DISORDERS Anxiety     negative neurological ROS  negative psych ROS   GI/Hepatic Neg liver ROS,GERD  ,,  Endo/Other  negative endocrine ROS    Renal/GU negative Renal ROS  negative genitourinary   Musculoskeletal   Abdominal   Peds  Hematology negative hematology ROS (+)   Anesthesia Other Findings Past Medical History: No date: Anxiety No date: Arthritis     Comment:  back and hands, left knee No date: HOH (hard of hearing)     Comment:  Right ear No date: Hyperlipidemia No date: Hypertension No date: Numbness     Comment:  right hand No date: Wears dentures     Comment:  upper dentures   Reproductive/Obstetrics negative OB ROS                             Anesthesia Physical Anesthesia Plan  ASA: 2  Anesthesia Plan: General   Post-op Pain Management:    Induction: Intravenous  PONV Risk Score and Plan: 2 and Ondansetron, Dexamethasone and Treatment may vary due to age or medical condition  Airway Management  Planned: LMA  Additional Equipment:   Intra-op Plan:   Post-operative Plan: Extubation in OR  Informed Consent: I have reviewed the patients History and Physical, chart, labs and discussed the procedure including the risks, benefits and alternatives for the proposed anesthesia with the patient or authorized representative who has indicated his/her understanding and acceptance.     Dental Advisory Given  Plan Discussed with: Anesthesiologist, CRNA and Surgeon  Anesthesia Plan Comments:        Anesthesia Quick Evaluation

## 2022-04-26 NOTE — Transfer of Care (Signed)
Immediate Anesthesia Transfer of Care Note  Patient: Amanda Nelson  Procedure(s) Performed: Maryclare Bean (Left: Toe)  Patient Location: PACU  Anesthesia Type: General  Level of Consciousness: awake, alert  and patient cooperative  Airway and Oxygen Therapy: Patient Spontanous Breathing and Patient connected to supplemental oxygen  Post-op Assessment: Post-op Vital signs reviewed, Patient's Cardiovascular Status Stable, Respiratory Function Stable, Patent Airway and No signs of Nausea or vomiting  Post-op Vital Signs: Reviewed and stable  Complications: No notable events documented.

## 2022-04-27 ENCOUNTER — Encounter: Payer: Self-pay | Admitting: Podiatry

## 2022-05-02 DIAGNOSIS — M79672 Pain in left foot: Secondary | ICD-10-CM | POA: Diagnosis not present

## 2022-05-02 DIAGNOSIS — M2012 Hallux valgus (acquired), left foot: Secondary | ICD-10-CM | POA: Diagnosis not present

## 2022-05-10 ENCOUNTER — Other Ambulatory Visit: Payer: Self-pay | Admitting: Family Medicine

## 2022-05-26 ENCOUNTER — Other Ambulatory Visit: Payer: Self-pay | Admitting: Acute Care

## 2022-05-26 DIAGNOSIS — F17210 Nicotine dependence, cigarettes, uncomplicated: Secondary | ICD-10-CM

## 2022-05-26 DIAGNOSIS — Z122 Encounter for screening for malignant neoplasm of respiratory organs: Secondary | ICD-10-CM

## 2022-05-26 DIAGNOSIS — Z87891 Personal history of nicotine dependence: Secondary | ICD-10-CM

## 2022-06-06 IMAGING — CT CT CHEST LUNG CANCER SCREENING LOW DOSE W/O CM
2 of 5 series · 15 of 40 positions shown, 18 images · non-contrast
Comparison: None.

CLINICAL DATA: Current smoker, 29 pack-year history.



[Series 3: lung 1.00 · axial · 0.63mm/px · z∈[-1177,-892]mm · 12 of 315 slices shown, 15 images]
[im 15/315  mediastinal]
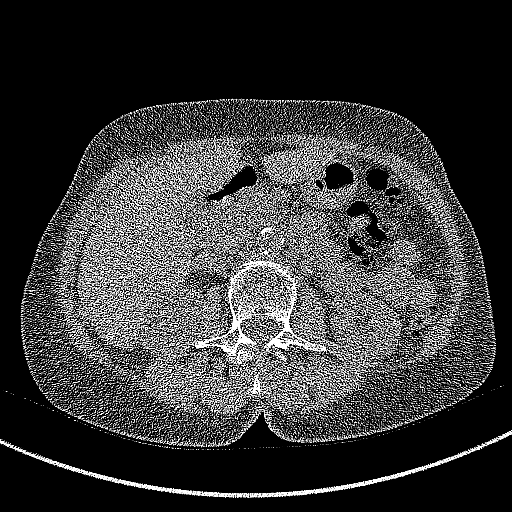
[im 15/315  lung]
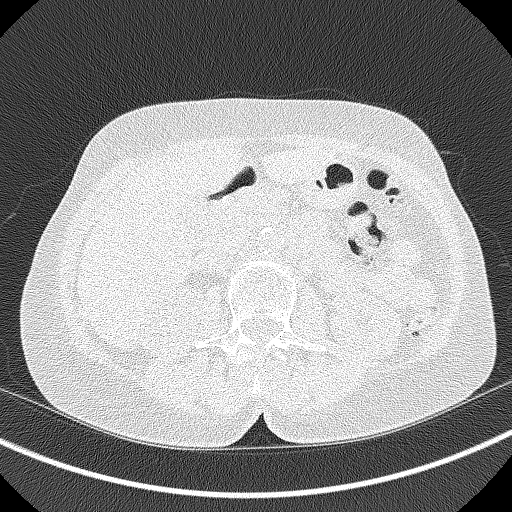
[im 43/315  lung]
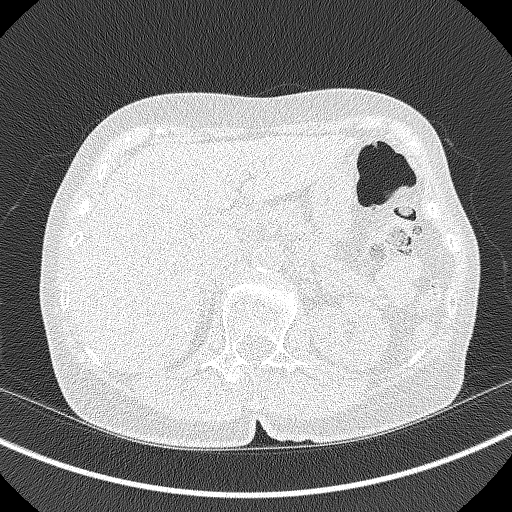
[im 72/315  lung]
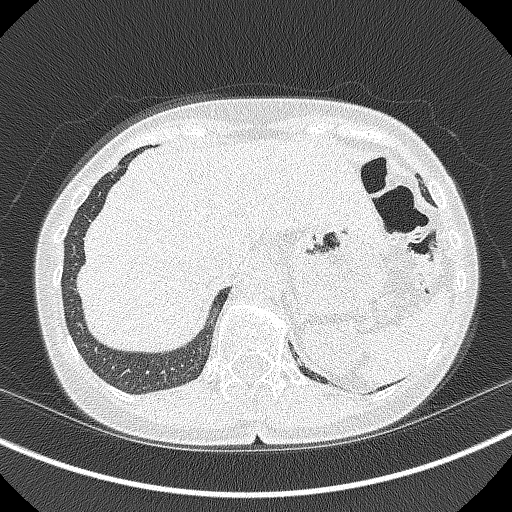
[im 100/315  lung]
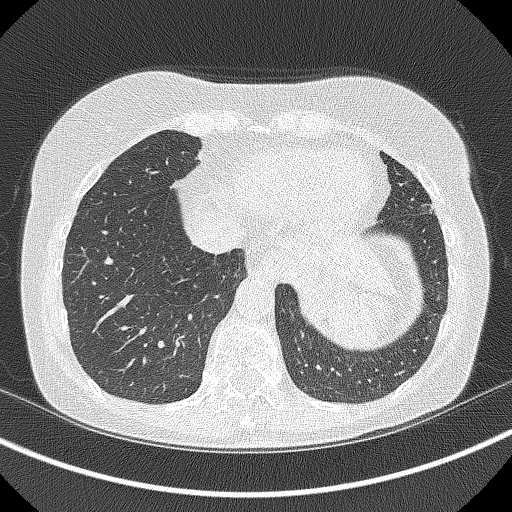
[im 115/315  mediastinal]
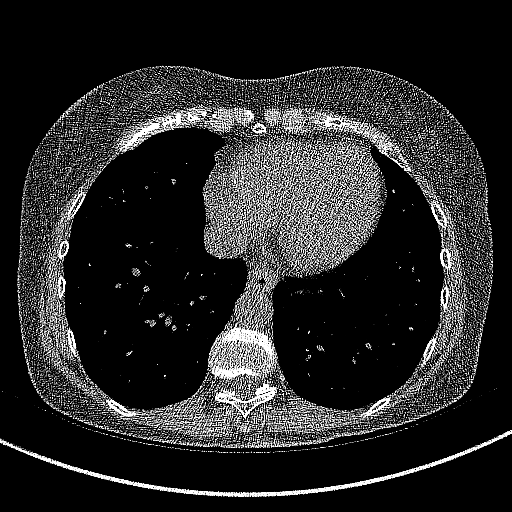
[im 115/315  lung]
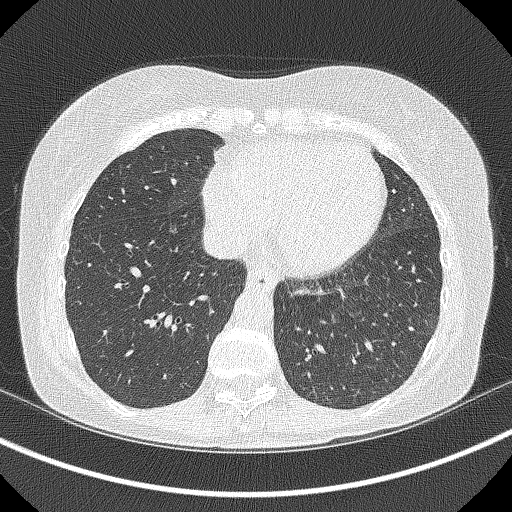
[im 143/315  lung]
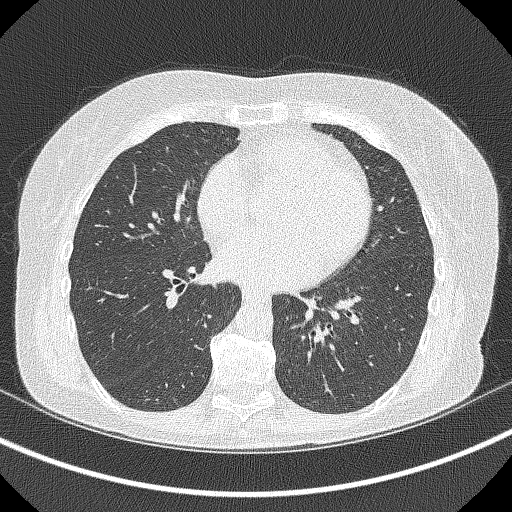
[im 172/315  lung]
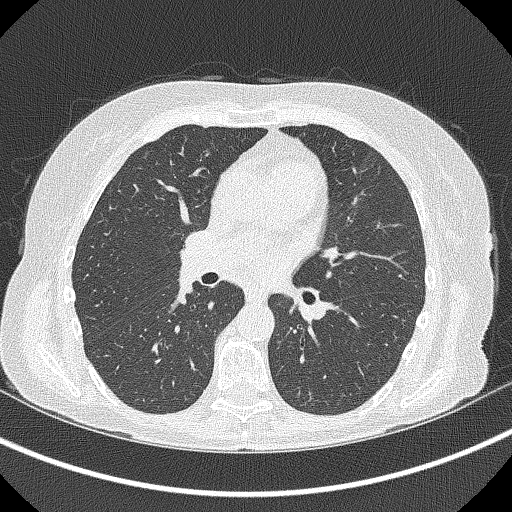
[im 200/315  lung]
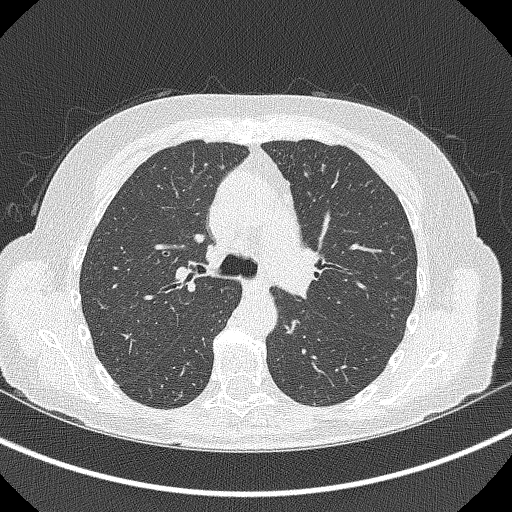
[im 215/315  mediastinal]
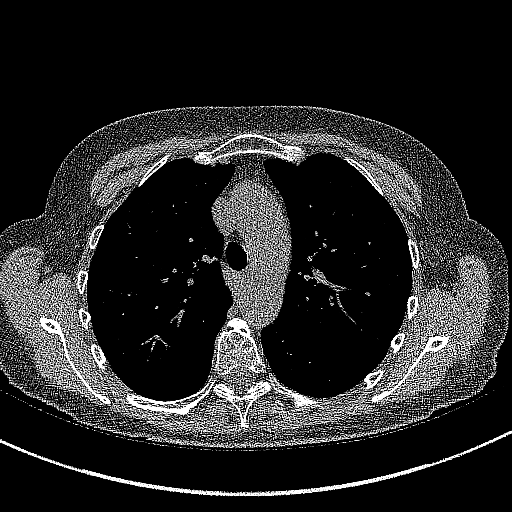
[im 215/315  lung]
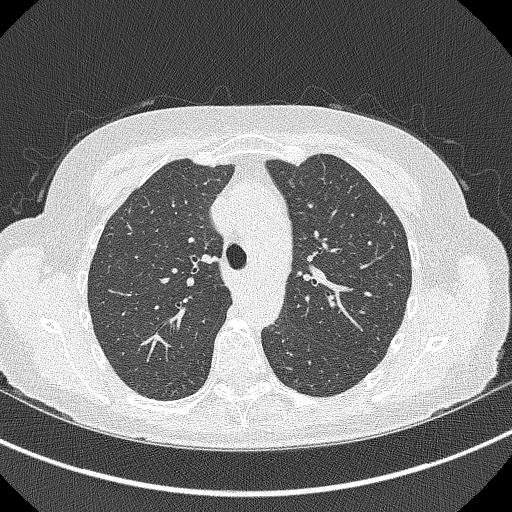
[im 243/315  lung]
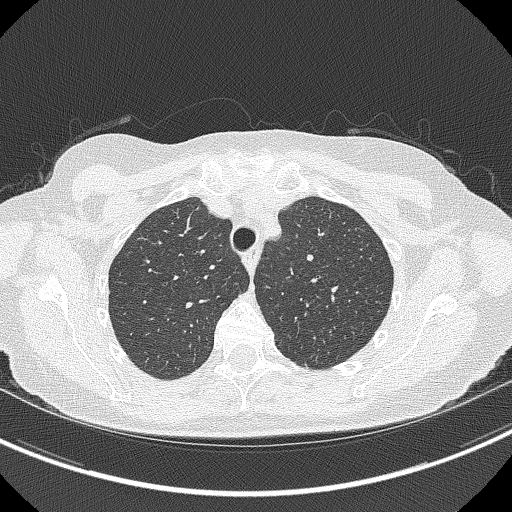
[im 272/315  lung]
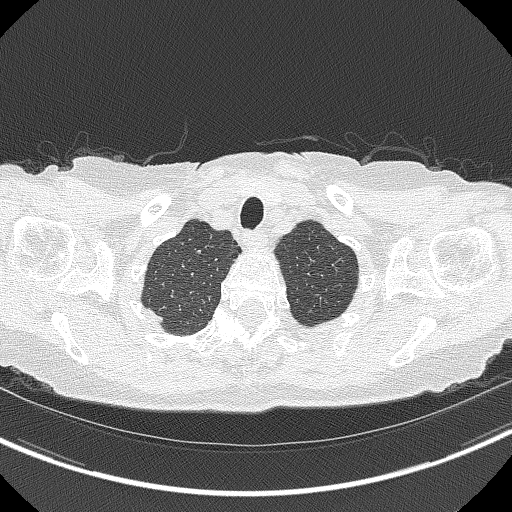
[im 300/315  lung]
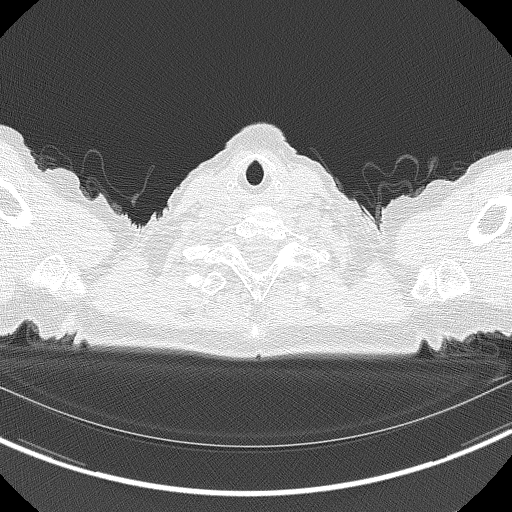

[Series 5: coronals lung 1.00 cor · coronal · 0.62mm/px · 3 of 274 slices shown]
[im 55/274  lung]
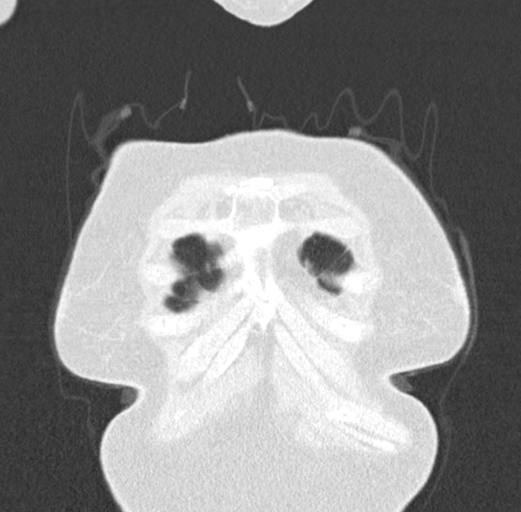
[im 110/274  lung]
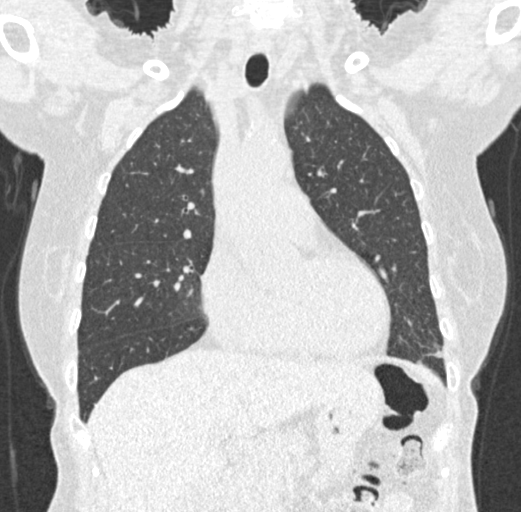
[im 164/274  lung]
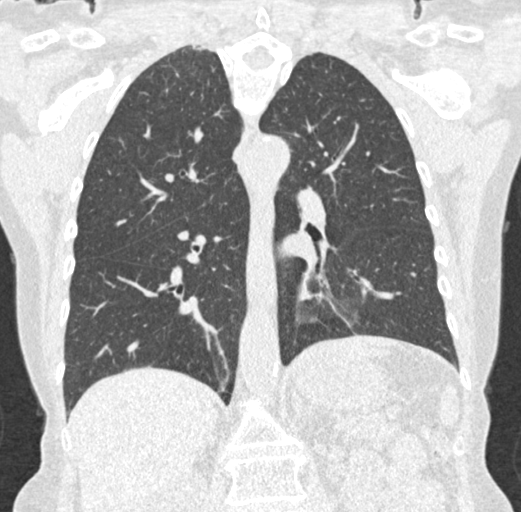

[15 of 40 positions shown; findings below may reference images not displayed]

FINDINGS: Cardiovascular: Atherosclerotic calcification of the aorta and
aortic valve. Heart size normal. No pericardial effusion.

Mediastinum/Nodes: No pathologically enlarged mediastinal or
axillary lymph nodes. Hilar regions are difficult to definitively
evaluate without IV contrast. Esophagus is grossly unremarkable.

Lungs/Pleura: Right apical pleuroparenchymal scarring. Centrilobular
and paraseptal emphysema. Smoking related respiratory bronchiolitis.
Pulmonary nodules measure 5.9 mm or less in size. No suspicious
pulmonary nodules. No pleural fluid. Debris is seen in the airway.

Upper Abdomen: Visualized portions of the liver, gallbladder,
adrenal glands, kidneys, spleen, pancreas, stomach and bowel are
grossly unremarkable.

Musculoskeletal: No worrisome lytic or sclerotic lesions.
IMPRESSION: 1. Lung-RADS 2, benign appearance or behavior. Continue annual
screening with low-dose chest CT without contrast in 12 months.
2.  Aortic atherosclerosis ([id number].D).
3.  Emphysema (2N92B-QKL.I).

## 2022-06-08 DIAGNOSIS — M79672 Pain in left foot: Secondary | ICD-10-CM | POA: Diagnosis not present

## 2022-06-14 ENCOUNTER — Ambulatory Visit
Admission: RE | Admit: 2022-06-14 | Discharge: 2022-06-14 | Disposition: A | Discharge status: Home / Self Care | Payer: Medicare HMO | Source: Ambulatory Visit | Attending: Family Medicine | Admitting: Family Medicine

## 2022-06-14 DIAGNOSIS — Z1231 Encounter for screening mammogram for malignant neoplasm of breast: Secondary | ICD-10-CM | POA: Diagnosis not present

## 2022-06-14 DIAGNOSIS — Z0000 Encounter for general adult medical examination without abnormal findings: Secondary | ICD-10-CM | POA: Diagnosis not present

## 2022-06-15 NOTE — Progress Notes (Signed)
[facility]  Normal mammogram; repeat in 1 year.  Please let us know if you have any questions.  Thank you,  Tally Joe, FNP

## 2022-06-20 ENCOUNTER — Ambulatory Visit
Admission: RE | Admit: 2022-06-20 | Discharge: 2022-06-20 | Disposition: A | Discharge status: Home / Self Care | Payer: Medicare HMO | Source: Ambulatory Visit | Attending: Acute Care | Admitting: Acute Care

## 2022-06-20 DIAGNOSIS — Z87891 Personal history of nicotine dependence: Secondary | ICD-10-CM | POA: Diagnosis not present

## 2022-06-20 DIAGNOSIS — Z122 Encounter for screening for malignant neoplasm of respiratory organs: Secondary | ICD-10-CM

## 2022-06-20 DIAGNOSIS — F17210 Nicotine dependence, cigarettes, uncomplicated: Secondary | ICD-10-CM | POA: Diagnosis not present

## 2022-06-22 ENCOUNTER — Other Ambulatory Visit: Payer: Self-pay | Admitting: Acute Care

## 2022-06-22 DIAGNOSIS — Z122 Encounter for screening for malignant neoplasm of respiratory organs: Secondary | ICD-10-CM

## 2022-06-22 DIAGNOSIS — Z87891 Personal history of nicotine dependence: Secondary | ICD-10-CM

## 2022-06-22 DIAGNOSIS — F17210 Nicotine dependence, cigarettes, uncomplicated: Secondary | ICD-10-CM

## 2022-07-18 DIAGNOSIS — M2012 Hallux valgus (acquired), left foot: Secondary | ICD-10-CM | POA: Diagnosis not present

## 2022-10-10 DIAGNOSIS — M79672 Pain in left foot: Secondary | ICD-10-CM | POA: Diagnosis not present

## 2022-10-10 DIAGNOSIS — S92902K Unspecified fracture of left foot, subsequent encounter for fracture with nonunion: Secondary | ICD-10-CM | POA: Diagnosis not present

## 2022-10-11 ENCOUNTER — Other Ambulatory Visit: Payer: Self-pay | Admitting: Podiatry

## 2022-10-11 DIAGNOSIS — S92902K Unspecified fracture of left foot, subsequent encounter for fracture with nonunion: Secondary | ICD-10-CM

## 2022-10-12 ENCOUNTER — Encounter: Payer: Self-pay | Admitting: Podiatry

## 2022-10-17 ENCOUNTER — Ambulatory Visit
Admission: RE | Admit: 2022-10-17 | Discharge: 2022-10-17 | Disposition: A | Discharge status: Home / Self Care | Payer: Medicare HMO | Source: Ambulatory Visit | Attending: Podiatry | Admitting: Podiatry

## 2022-10-17 DIAGNOSIS — M19072 Primary osteoarthritis, left ankle and foot: Secondary | ICD-10-CM | POA: Diagnosis not present

## 2022-10-17 DIAGNOSIS — S92902K Unspecified fracture of left foot, subsequent encounter for fracture with nonunion: Secondary | ICD-10-CM

## 2022-10-17 DIAGNOSIS — M79672 Pain in left foot: Secondary | ICD-10-CM | POA: Diagnosis not present

## 2022-10-17 DIAGNOSIS — Z9889 Other specified postprocedural states: Secondary | ICD-10-CM | POA: Diagnosis not present

## 2022-12-12 DIAGNOSIS — R0600 Dyspnea, unspecified: Secondary | ICD-10-CM | POA: Diagnosis not present

## 2022-12-12 DIAGNOSIS — Z03818 Encounter for observation for suspected exposure to other biological agents ruled out: Secondary | ICD-10-CM | POA: Diagnosis not present

## 2022-12-12 DIAGNOSIS — J849 Interstitial pulmonary disease, unspecified: Secondary | ICD-10-CM | POA: Diagnosis not present

## 2022-12-12 DIAGNOSIS — J449 Chronic obstructive pulmonary disease, unspecified: Secondary | ICD-10-CM | POA: Diagnosis not present

## 2022-12-12 DIAGNOSIS — R062 Wheezing: Secondary | ICD-10-CM | POA: Diagnosis not present

## 2022-12-12 DIAGNOSIS — J441 Chronic obstructive pulmonary disease with (acute) exacerbation: Secondary | ICD-10-CM | POA: Diagnosis not present

## 2022-12-16 ENCOUNTER — Inpatient Hospital Stay
Admission: EM | Admit: 2022-12-16 | Discharge: 2022-12-18 | DRG: 193 | Disposition: A | Discharge status: Home / Self Care | Payer: Medicare HMO | Attending: Internal Medicine | Admitting: Internal Medicine

## 2022-12-16 ENCOUNTER — Other Ambulatory Visit: Payer: Self-pay

## 2022-12-16 ENCOUNTER — Emergency Department: Payer: Medicare HMO

## 2022-12-16 DIAGNOSIS — Z823 Family history of stroke: Secondary | ICD-10-CM | POA: Diagnosis not present

## 2022-12-16 DIAGNOSIS — F419 Anxiety disorder, unspecified: Secondary | ICD-10-CM | POA: Diagnosis present

## 2022-12-16 DIAGNOSIS — J9601 Acute respiratory failure with hypoxia: Secondary | ICD-10-CM | POA: Diagnosis not present

## 2022-12-16 DIAGNOSIS — J159 Unspecified bacterial pneumonia: Principal | ICD-10-CM | POA: Diagnosis present

## 2022-12-16 DIAGNOSIS — Z1152 Encounter for screening for COVID-19: Secondary | ICD-10-CM | POA: Diagnosis not present

## 2022-12-16 DIAGNOSIS — J440 Chronic obstructive pulmonary disease with acute lower respiratory infection: Secondary | ICD-10-CM | POA: Diagnosis not present

## 2022-12-16 DIAGNOSIS — J189 Pneumonia, unspecified organism: Secondary | ICD-10-CM | POA: Diagnosis not present

## 2022-12-16 DIAGNOSIS — R918 Other nonspecific abnormal finding of lung field: Secondary | ICD-10-CM | POA: Diagnosis not present

## 2022-12-16 DIAGNOSIS — R0602 Shortness of breath: Secondary | ICD-10-CM | POA: Diagnosis not present

## 2022-12-16 DIAGNOSIS — I10 Essential (primary) hypertension: Secondary | ICD-10-CM | POA: Diagnosis not present

## 2022-12-16 DIAGNOSIS — Z791 Long term (current) use of non-steroidal anti-inflammatories (NSAID): Secondary | ICD-10-CM | POA: Diagnosis not present

## 2022-12-16 DIAGNOSIS — Z79899 Other long term (current) drug therapy: Secondary | ICD-10-CM | POA: Diagnosis not present

## 2022-12-16 DIAGNOSIS — I700 Atherosclerosis of aorta: Secondary | ICD-10-CM | POA: Diagnosis not present

## 2022-12-16 DIAGNOSIS — R911 Solitary pulmonary nodule: Secondary | ICD-10-CM | POA: Diagnosis present

## 2022-12-16 DIAGNOSIS — E7800 Pure hypercholesterolemia, unspecified: Secondary | ICD-10-CM | POA: Diagnosis not present

## 2022-12-16 DIAGNOSIS — J441 Chronic obstructive pulmonary disease with (acute) exacerbation: Secondary | ICD-10-CM | POA: Diagnosis not present

## 2022-12-16 DIAGNOSIS — J984 Other disorders of lung: Secondary | ICD-10-CM | POA: Diagnosis not present

## 2022-12-16 DIAGNOSIS — R059 Cough, unspecified: Secondary | ICD-10-CM | POA: Insufficient documentation

## 2022-12-16 DIAGNOSIS — E872 Acidosis, unspecified: Secondary | ICD-10-CM | POA: Diagnosis present

## 2022-12-16 DIAGNOSIS — F17210 Nicotine dependence, cigarettes, uncomplicated: Secondary | ICD-10-CM | POA: Diagnosis present

## 2022-12-16 DIAGNOSIS — R7989 Other specified abnormal findings of blood chemistry: Secondary | ICD-10-CM | POA: Insufficient documentation

## 2022-12-16 DIAGNOSIS — N393 Stress incontinence (female) (male): Secondary | ICD-10-CM | POA: Diagnosis present

## 2022-12-16 DIAGNOSIS — G629 Polyneuropathy, unspecified: Secondary | ICD-10-CM | POA: Diagnosis present

## 2022-12-16 DIAGNOSIS — J439 Emphysema, unspecified: Secondary | ICD-10-CM | POA: Diagnosis not present

## 2022-12-16 DIAGNOSIS — Z720 Tobacco use: Secondary | ICD-10-CM | POA: Diagnosis present

## 2022-12-16 LAB — CBC WITH DIFFERENTIAL/PLATELET
Abs Immature Granulocytes: 0.05 10*3/uL (ref 0.00–0.07)
Basophils Absolute: 0 10*3/uL (ref 0.0–0.1)
Basophils Relative: 0 %
Eosinophils Absolute: 0 10*3/uL (ref 0.0–0.5)
Eosinophils Relative: 0 %
HCT: 45.1 % (ref 36.0–46.0)
Hemoglobin: 15.2 g/dL — ABNORMAL HIGH (ref 12.0–15.0)
Immature Granulocytes: 1 %
Lymphocytes Relative: 9 %
Lymphs Abs: 0.9 10*3/uL (ref 0.7–4.0)
MCH: 31.5 pg (ref 26.0–34.0)
MCHC: 33.7 g/dL (ref 30.0–36.0)
MCV: 93.4 fL (ref 80.0–100.0)
Monocytes Absolute: 0.8 10*3/uL (ref 0.1–1.0)
Monocytes Relative: 7 %
Neutro Abs: 8.6 10*3/uL — ABNORMAL HIGH (ref 1.7–7.7)
Neutrophils Relative %: 83 %
Platelets: 258 10*3/uL (ref 150–400)
RBC: 4.83 MIL/uL (ref 3.87–5.11)
RDW: 13.4 % (ref 11.5–15.5)
WBC: 10.4 10*3/uL (ref 4.0–10.5)
nRBC: 0 % (ref 0.0–0.2)

## 2022-12-16 LAB — LACTIC ACID, PLASMA
Lactic Acid, Venous: 1.8 mmol/L (ref 0.5–1.9)
Lactic Acid, Venous: 2.4 mmol/L (ref 0.5–1.9)
Lactic Acid, Venous: 4 mmol/L (ref 0.5–1.9)

## 2022-12-16 LAB — URINALYSIS, COMPLETE (UACMP) WITH MICROSCOPIC
Bacteria, UA: NONE SEEN
Bilirubin Urine: NEGATIVE
Glucose, UA: NEGATIVE mg/dL
Ketones, ur: 5 mg/dL — AB
Leukocytes,Ua: NEGATIVE
Nitrite: NEGATIVE
Protein, ur: NEGATIVE mg/dL
Specific Gravity, Urine: 1.014 (ref 1.005–1.030)
pH: 5 (ref 5.0–8.0)

## 2022-12-16 LAB — BASIC METABOLIC PANEL
Anion gap: 12 (ref 5–15)
BUN: 12 mg/dL (ref 8–23)
CO2: 23 mmol/L (ref 22–32)
Calcium: 9.1 mg/dL (ref 8.9–10.3)
Chloride: 101 mmol/L (ref 98–111)
Creatinine, Ser: 0.67 mg/dL (ref 0.44–1.00)
GFR, Estimated: >60 mL/min (ref >60)
Glucose, Bld: 179 mg/dL — ABNORMAL HIGH (ref 70–99)
Potassium: 4 mmol/L (ref 3.5–5.1)
Sodium: 136 mmol/L (ref 135–145)

## 2022-12-16 LAB — PROCALCITONIN: Procalcitonin: <0.1 ng/mL

## 2022-12-16 LAB — SARS CORONAVIRUS 2 BY RT PCR: SARS Coronavirus 2 by RT PCR: NEGATIVE

## 2022-12-16 LAB — TROPONIN I (HIGH SENSITIVITY): Troponin I (High Sensitivity): <2 ng/L (ref <18)

## 2022-12-16 MED ORDER — LISINOPRIL 10 MG PO TABS
10.0000 mg | ORAL_TABLET | Freq: Every day | ORAL | Status: DC
  Administered 2022-12-17 – 2022-12-18 (×2): 10 mg via ORAL
  Filled 2022-12-16 (×2): qty 1

## 2022-12-16 MED ORDER — IPRATROPIUM-ALBUTEROL 0.5-2.5 (3) MG/3ML IN SOLN
3.0000 mL | Freq: Once | RESPIRATORY_TRACT | Status: AC
  Administered 2022-12-16: 3 mL via RESPIRATORY_TRACT
  Filled 2022-12-16: qty 3

## 2022-12-16 MED ORDER — DOXYCYCLINE HYCLATE 100 MG PO TABS
100.0000 mg | ORAL_TABLET | Freq: Once | ORAL | Status: AC
  Administered 2022-12-16: 100 mg via ORAL
  Filled 2022-12-16: qty 1

## 2022-12-16 MED ORDER — HYDRALAZINE HCL 20 MG/ML IJ SOLN: 5.0000 mg | Freq: Four times a day (QID) | INTRAMUSCULAR | Status: DC | PRN

## 2022-12-16 MED ORDER — LORAZEPAM 0.5 MG PO TABS: 0.5000 mg | ORAL_TABLET | Freq: Four times a day (QID) | ORAL | Status: DC | PRN

## 2022-12-16 MED ORDER — ENOXAPARIN SODIUM 40 MG/0.4ML IJ SOSY
40.0000 mg | PREFILLED_SYRINGE | Freq: Every day | INTRAMUSCULAR | Status: DC
  Administered 2022-12-16 – 2022-12-17 (×2): 40 mg via SUBCUTANEOUS
  Filled 2022-12-16 (×2): qty 0.4

## 2022-12-16 MED ORDER — DEXTROMETHORPHAN POLISTIREX ER 30 MG/5ML PO SUER: 5.0000 mL | Freq: Two times a day (BID) | ORAL | Status: DC | PRN

## 2022-12-16 MED ORDER — ONDANSETRON HCL 4 MG/2ML IJ SOLN: 4.0000 mg | Freq: Four times a day (QID) | INTRAMUSCULAR | Status: DC | PRN

## 2022-12-16 MED ORDER — ACETAMINOPHEN 650 MG RE SUPP: 650.0000 mg | Freq: Four times a day (QID) | RECTAL | Status: DC | PRN

## 2022-12-16 MED ORDER — NICOTINE 21 MG/24HR TD PT24: 21.0000 mg | MEDICATED_PATCH | Freq: Every day | TRANSDERMAL | Status: DC | PRN

## 2022-12-16 MED ORDER — SODIUM CHLORIDE 0.9 % IV SOLN: INTRAVENOUS | Status: DC | PRN

## 2022-12-16 MED ORDER — ALBUTEROL SULFATE (2.5 MG/3ML) 0.083% IN NEBU: 2.5000 mg | INHALATION_SOLUTION | Freq: Four times a day (QID) | RESPIRATORY_TRACT | Status: DC | PRN

## 2022-12-16 MED ORDER — MELATONIN 5 MG PO TABS
5.0000 mg | ORAL_TABLET | Freq: Every evening | ORAL | Status: DC | PRN
  Administered 2022-12-17: 5 mg via ORAL
  Filled 2022-12-16: qty 1

## 2022-12-16 MED ORDER — METHYLPREDNISOLONE SODIUM SUCC 40 MG IJ SOLR: 40.0000 mg | Freq: Every day | INTRAMUSCULAR | Status: DC

## 2022-12-16 MED ORDER — HYDROCOD POLI-CHLORPHE POLI ER 10-8 MG/5ML PO SUER: 5.0000 mL | Freq: Every evening | ORAL | Status: DC | PRN

## 2022-12-16 MED ORDER — SODIUM CHLORIDE 0.9 % IV SOLN
2.0000 g | Freq: Once | INTRAVENOUS | Status: AC
  Administered 2022-12-16: 2 g via INTRAVENOUS
  Filled 2022-12-16: qty 20

## 2022-12-16 MED ORDER — AMLODIPINE BESYLATE 5 MG PO TABS
5.0000 mg | ORAL_TABLET | Freq: Every day | ORAL | Status: DC
  Administered 2022-12-17 – 2022-12-18 (×2): 5 mg via ORAL
  Filled 2022-12-16 (×2): qty 1

## 2022-12-16 MED ORDER — LACTATED RINGERS IV BOLUS
1000.0000 mL | Freq: Once | INTRAVENOUS | Status: AC
  Administered 2022-12-16: 1000 mL via INTRAVENOUS

## 2022-12-16 MED ORDER — SODIUM CHLORIDE 0.9 % IV SOLN
2.0000 g | INTRAVENOUS | Status: DC
  Administered 2022-12-17: 2 g via INTRAVENOUS
  Filled 2022-12-16 (×2): qty 20

## 2022-12-16 MED ORDER — SODIUM CHLORIDE 0.9 % IV SOLN
500.0000 mg | INTRAVENOUS | Status: DC
  Administered 2022-12-16 – 2022-12-17 (×2): 500 mg via INTRAVENOUS
  Filled 2022-12-16 (×2): qty 5

## 2022-12-16 MED ORDER — IPRATROPIUM-ALBUTEROL 0.5-2.5 (3) MG/3ML IN SOLN
3.0000 mL | Freq: Three times a day (TID) | RESPIRATORY_TRACT | Status: AC
  Administered 2022-12-16 – 2022-12-17 (×3): 3 mL via RESPIRATORY_TRACT
  Filled 2022-12-16 (×3): qty 3

## 2022-12-16 MED ORDER — ONDANSETRON HCL 4 MG PO TABS: 4.0000 mg | ORAL_TABLET | Freq: Four times a day (QID) | ORAL | Status: DC | PRN

## 2022-12-16 MED ORDER — ACETAMINOPHEN 325 MG PO TABS: 650.0000 mg | ORAL_TABLET | Freq: Four times a day (QID) | ORAL | Status: DC | PRN

## 2022-12-16 MED ORDER — SENNOSIDES-DOCUSATE SODIUM 8.6-50 MG PO TABS: 1.0000 | ORAL_TABLET | Freq: Every evening | ORAL | Status: DC | PRN

## 2022-12-16 MED ORDER — ROSUVASTATIN CALCIUM 20 MG PO TABS
20.0000 mg | ORAL_TABLET | Freq: Every day | ORAL | Status: DC
  Administered 2022-12-16 – 2022-12-18 (×3): 20 mg via ORAL
  Filled 2022-12-16: qty 1
  Filled 2022-12-16 (×2): qty 2
  Filled 2022-12-16 (×2): qty 1
  Filled 2022-12-16: qty 2

## 2022-12-16 MED ORDER — METHYLPREDNISOLONE SODIUM SUCC 125 MG IJ SOLR
125.0000 mg | Freq: Once | INTRAMUSCULAR | Status: AC
  Administered 2022-12-16: 125 mg via INTRAVENOUS
  Filled 2022-12-16: qty 2

## 2022-12-16 MED ORDER — GUAIFENESIN 100 MG/5ML PO LIQD: 5.0000 mL | ORAL | Status: DC | PRN

## 2022-12-16 NOTE — Assessment & Plan Note (Addendum)
As needed nicotine patch ordered Patient is not ready to quit smoking

## 2022-12-16 NOTE — Assessment & Plan Note (Addendum)
On second value Query lab error Blood cultures x 2 are in process Continue antibiotic for treatment of pneumonia Repeat a third lactic acid scheduled for 3 hours later, at 21:11

## 2022-12-16 NOTE — Assessment & Plan Note (Signed)
Right middle lobe pneumonia with possible involvement of the left lung base Query multifocal/multilobar pneumonia Azithromycin 500 mg IV daily, 5-day course ordered Continue with ceftriaxone 2 g IV daily, to complete 5-day course Check lactic acid, procalcitonin on admission DuoNebs 3 times daily, 4 doses ordered; Solu-Medrol 40 mg IV, daily ordered for 12/17/2022, 1 dose ordered Admit to telemetry medical, inpatient

## 2022-12-16 NOTE — Assessment & Plan Note (Addendum)
Home amlodipine 5 mg daily, lisinopril 10 mg daily resumed Hydralazine 5 mg IV every 6 hours as needed for SBP > 165

## 2022-12-16 NOTE — Assessment & Plan Note (Addendum)
Patient reports that she leaks urine every time she coughs and that this has stopped since she was placed on oxygen in the hospital Patient further reports that since her undergarments stay wet due to the leakage of urine with coughing, it causes her to feel itchiness at the vaginal labia I recommended to patient patient that she will need outpatient follow-up with OB/GYN provider for a pelvic exam outpatient to evaluate for vaginal prolapse and/or bladder prolapse into the vaginal wall and pessary fitting as needed She endorses understanding and compliance

## 2022-12-16 NOTE — Assessment & Plan Note (Signed)
Suspect secondary to multi lobar for pneumonia Azithromycin, ceftriaxone IV daily, to complete 5-day course Continue oxygen supplementation to maintain SpO2 greater than 92% DuoNebs, Solu-Medrol Incentive spirometry, flutter valve Admit to telemetry medical, inpatient

## 2022-12-16 NOTE — Progress Notes (Signed)
Lactic acid 2.4. Dr Sedalia Muta notified

## 2022-12-16 NOTE — H&P (Signed)
History and Physical   Amanda Nelson IOE:[id number] DOB: 1950-05-18 DOA: 12/16/2022  PCP: Jacky Kindle, FNP  Outpatient Specialists: Dr. Ether Griffins, podiatry Patient coming from: Home via EMS  I have personally briefly reviewed patient's old medical records in [facility] EMR.  Chief Concern: Short of breath, cough  HPI: Ms. Amanda Nelson is a 72 year old female with history of hypertension, hyperlipidemia, neuropathy, who presents emergency department for chief concerns of cough and shortness of breath for about 1 week.  Vitals in the ED showed temperature of 98.9, respiration rate of 18, heart rate of 80, blood pressure 141/73, SpO2 of 87% on room air.  Serum sodium is 136, potassium 4.0, chloride 101, bicarb 23, BUN of 12, serum creatinine 0.67, EGFR greater than 60, nonfasting blood glucose 179,  WBC 10.4, hemoglobin 15.2, platelets of 258.  Procalcitonin was less than 0.10.  High sensitive troponin was less than 2.  Chest x-ray 2 view: Was read as right middle lobe airspace disease typical of pneumonia.  Left lower patchy opacity at the left lung base, may be atelectasis or pneumonia.  Recommend PA and lateral chest x-ray in 3 to 4 weeks following trial of antibiotic therapy to ensure resolution and exclude underlying malignancy.  ED treatment: DuoNebs 3 doses ordered/high dose, Solu-Medrol 125 mg IV one-time dose, ceftriaxone 2 g IV one-time dose, doxycycline 100 mg p.o. one-time dose. --------------------------------- At bedside, patient is able to tell me her name, age, current location, current calendar year.   She reports cough and shortness of breath for about 1 week. She reports stress incontinence with cough.   She endorses cough is productive to thick yellows. She denies fever, chest pain, nausea, vomiting, dysuria.  She endorses leakage of urine every time she coughs.  She reports that she has not had the symptoms since she was placed on oxygen.  She denies  hematuria.  She endorses 1 episode of diarrhea today prior to ED presentation.  Denies known sick contacts.  Social history: She lives at home with her husband of 50+ years. She is a current tobacco user, smoking more than 0.5 ppd. She endorses infrequent etoh use. She denies recreational drug use.   ROS: Constitutional: no weight change, no fever ENT/Mouth: no sore throat, no rhinorrhea Eyes: no eye pain, no vision changes Cardiovascular: no chest pain, + dyspnea,  no edema, no palpitations Respiratory: + cough, + sputum, no wheezing Gastrointestinal: no nausea, no vomiting, no diarrhea, no constipation Genitourinary: no urinary incontinence, no dysuria, no hematuria, + stress incontinence Musculoskeletal: no arthralgias, no myalgias Skin: no skin lesions, no pruritus, Neuro: + weakness, no loss of consciousness, no syncope Psych: no anxiety, no depression, + decrease appetite Heme/Lymph: no bruising, no bleeding  ED Course: Discussed with emergency medicine provider, patient requiring hospitalization for chief concerns of acute hypoxic respiratory failure, query COPD exacerbation.  Assessment/Plan  Principal Problem:   Acute hypoxemic respiratory failure (HCC) Active Problems:   Tobacco use   Anxiety disorder   Essential hypertension   Pure hypercholesterolemia   CAP (community acquired pneumonia)   Cough   Stress incontinence in female   Increased lactic acid level   Assessment and Plan:  * Acute hypoxemic respiratory failure (HCC) Suspect secondary to multi lobar for pneumonia Azithromycin, ceftriaxone IV daily, to complete 5-day course Continue oxygen supplementation to maintain SpO2 greater than 92% DuoNebs, Solu-Medrol Incentive spirometry, flutter valve Admit to telemetry medical, inpatient  Increased lactic acid level On second value Query lab error Blood cultures  x 2 are in process Continue antibiotic for treatment of pneumonia Repeat a third lactic acid  scheduled for 3 hours later, at 21:11  Stress incontinence in female Patient reports that she leaks urine every time she coughs and that this has stopped since she was placed on oxygen in the hospital Patient further reports that since her undergarments stay wet due to the leakage of urine with coughing, it causes her to feel itchiness at the vaginal labia I recommended to patient patient that she will need outpatient follow-up with OB/GYN provider for a pelvic exam outpatient to evaluate for vaginal prolapse and/or bladder prolapse into the vaginal wall and pessary fitting as needed She endorses understanding and compliance  Cough Suspect secondary to pneumonia Robitussin 5 mL, p.o. every 6 hours as needed for cough to loosen phlegm, 3 days ordered; Tussionex 5 mg p.o. nightly as needed for cough, 2 days ordered  CAP (community acquired pneumonia) Right middle lobe pneumonia with possible involvement of the left lung base Query multifocal/multilobar pneumonia Azithromycin 500 mg IV daily, 5-day course ordered Continue with ceftriaxone 2 g IV daily, to complete 5-day course Check lactic acid, procalcitonin on admission DuoNebs 3 times daily, 4 doses ordered; Solu-Medrol 40 mg IV, daily ordered for 12/17/2022, 1 dose ordered Admit to telemetry medical, inpatient  Pure hypercholesterolemia Rosuvastatin 20 mg daily resumed  Essential hypertension Home amlodipine 5 mg daily, lisinopril 10 mg daily resumed Hydralazine 5 mg IV every 6 hours as needed for SBP > 165  Anxiety disorder Lorazepam 0.5 mg p.o. every 6 hours as needed for anxiety, 2 doses ordered  Tobacco use As needed nicotine patch ordered Patient is not ready to quit smoking  Chart reviewed.   DVT prophylaxis: Enoxaparin 40 mg subcutaneous nightly Code Status: full code Diet: Heart healthy Family Communication: updated spouse at bedside Disposition Plan: Pending clinical course Consults called: None at this  time Admission status: Telemetry medical, inpatient  Past Medical History:  Diagnosis Date   Anxiety    Arthritis    back and hands, left knee   HOH (hard of hearing)    Right ear   Hyperlipidemia    Hypertension    Numbness    right hand   Wears dentures    upper dentures   Past Surgical History:  Procedure Laterality Date   BUNIONECTOMY Left 04/26/2022   Procedure: BUNIONECTOMY - LAPIDUS;  Surgeon: Gwyneth Revels, DPM;  Location: [facility];  Service: Podiatry;  Laterality: Left;   COLONOSCOPY     TUBAL LIGATION  03/14/1979   Social History:  reports that she has been smoking cigarettes. She has a 19.5 pack-year smoking history. She has never used smokeless tobacco. She reports that she does not currently use alcohol. She reports that she does not use drugs.  No Known Allergies Family History  Problem Relation Age of Onset   Kidney cancer Sister    Cancer Sister        cancer   Stroke Brother    Prostate cancer Brother    Breast cancer Neg Hx    Family history: Family history reviewed and not pertinent.  Prior to Admission medications   Medication Sig Start Date End Date Taking? Authorizing Provider  amLODipine (NORVASC) 5 MG tablet Take 1 tablet (5 mg total) by mouth daily. 02/23/22   Jacky Kindle, FNP  cyclobenzaprine (FLEXERIL) 10 MG tablet Take by mouth. 02/23/22   [provider]  gabapentin (NEURONTIN) 100 MG capsule Take 1 capsule (100  mg total) by mouth at bedtime. 04/20/22   Richardean Sale, DO  lisinopril (ZESTRIL) 10 MG tablet Take 1 tablet (10 mg total) by mouth daily. 02/23/22   Jacky Kindle, FNP  meloxicam (MOBIC) 15 MG tablet Take 1 tablet (15 mg total) by mouth daily. 04/20/22   Richardean Sale, DO  oxyCODONE-acetaminophen (PERCOCET/ROXICET) 5-325 MG tablet Take by mouth. 04/18/22   [provider]  rosuvastatin (CRESTOR) 20 MG tablet Take 1 tablet (20 mg total) by mouth daily. 02/23/22   Jacky Kindle, FNP   Physical  Exam: Vitals:   12/16/22 1301 12/16/22 1645  BP: (!) 141/73 132/68  Pulse: 80 78  Resp: 18 18  Temp: 98.9 F (37.2 C) 98.1 F (36.7 C)  TempSrc: Oral Oral  SpO2: (!) 87% 95%  Weight: 49.9 kg   Height: 5' (1.524 m)    Constitutional: appears age-appropriate, NAD, calm Eyes: PERRL, lids and conjunctivae normal ENMT: Mucous membranes are moist. Posterior pharynx clear of any exudate or lesions. Age-appropriate dentition. Hearing appropriate Neck: normal, supple, no masses, no thyromegaly Respiratory: clear to auscultation bilaterally, no wheezing, no crackles. Normal respiratory effort. No accessory muscle use.  Cardiovascular: Regular rate and rhythm, no murmurs / rubs / gallops. No extremity edema. 2+ pedal pulses. No carotid bruits.  Abdomen: no tenderness, no masses palpated, no hepatosplenomegaly. Bowel sounds positive.  Musculoskeletal: no clubbing / cyanosis. No joint deformity upper and lower extremities. Good ROM, no contractures, no atrophy. Normal muscle tone.  Skin: no rashes, lesions, ulcers. No induration Neurologic: Sensation intact. Strength 5/5 in all 4.  Psychiatric: Normal judgment and insight. Alert and oriented x 3. Normal mood.   EKG: independently reviewed, showing sinus rhythm with a rate of 73, QTc 398  Chest x-ray on Admission: I personally reviewed and I agree with radiologist reading as below.  DG Chest 2 View  Result Date: 12/16/2022 CLINICAL DATA:  Cough and shortness of breath. EXAM: CHEST - 2 VIEW COMPARISON:  Chest CT 06/20/2022 FINDINGS: Right middle lobe airspace disease typical of pneumonia. Lesser patchy opacity at the left lung base. Mild chronic hyperinflation and bronchial thickening. The heart is normal in size. Aortic atherosclerosis. No pleural effusion or pneumothorax. Thoracic spondylosis. IMPRESSION: Right middle lobe airspace disease typical of pneumonia. Lesser patchy opacity at the left lung base may be atelectasis or pneumonia. Followup  PA and lateral chest X-ray is recommended in 3-4 weeks following trial of antibiotic therapy to ensure resolution and exclude underlying malignancy. Electronically Signed   By: Narda Rutherford M.D.   On: 12/16/2022 15:01    Labs on Admission: I have personally reviewed following labs  CBC: Recent Labs  Lab 12/16/22 1439  WBC 10.4  NEUTROABS 8.6*  HGB 15.2*  HCT 45.1  MCV 93.4  PLT 258   Basic Metabolic Panel: Recent Labs  Lab 12/16/22 1309  NA 136  K 4.0  CL 101  CO2 23  GLUCOSE 179*  BUN 12  CREATININE 0.67  CALCIUM 9.1   GFR: Estimated Creatinine Clearance: 46.3 mL/min (by C-G formula based on SCr of 0.67 mg/dL).  Urine analysis:    Component Value Date/Time   COLORURINE YELLOW (A) 12/16/2022 1757   APPEARANCEUR CLEAR (A) 12/16/2022 1757   APPEARANCEUR Cloudy (A) 02/07/2016 1426   LABSPEC 1.014 12/16/2022 1757   PHURINE 5.0 12/16/2022 1757   GLUCOSEU NEGATIVE 12/16/2022 1757   HGBUR MODERATE (A) 12/16/2022 1757   BILIRUBINUR NEGATIVE 12/16/2022 1757   BILIRUBINUR Negative 05/10/2021 1423   BILIRUBINUR  Negative 02/07/2016 1426   KETONESUR 5 (A) 12/16/2022 1757   PROTEINUR NEGATIVE 12/16/2022 1757   UROBILINOGEN 0.2 05/10/2021 1423   NITRITE NEGATIVE 12/16/2022 1757   LEUKOCYTESUR NEGATIVE 12/16/2022 1757   This document was prepared using Dragon Voice Recognition software and may include unintentional dictation errors.  Dr. Sedalia Muta [facility]  If 7PM-7AM, please contact overnight-coverage provider If 7AM-7PM, please contact day attending provider [email or web address]  12/16/2022, 6:58 PM

## 2022-12-16 NOTE — ED Triage Notes (Addendum)
Pt to ED c/o SHOB and cough X 1 week, evaluated for the same on 10/1 and started on abx, prednisone, and inhaler. Continues to have SHOB. Still taking abx and prednisone. No s/s of acute distress noted in triage. No cp . No known sick contacts. Current smoker 1/2 ppd

## 2022-12-16 NOTE — Assessment & Plan Note (Signed)
Suspect secondary to pneumonia Robitussin 5 mL, p.o. every 6 hours as needed for cough to loosen phlegm, 3 days ordered; Tussionex 5 mg p.o. nightly as needed for cough, 2 days ordered

## 2022-12-16 NOTE — ED Provider Notes (Signed)
[facility] Provider Note    Event Date/Time   First MD Initiated Contact with Patient 12/16/22 1415     (approximate)   History   Shortness of Breath and Cough   HPI  Amanda Nelson is a 72 y.o. female with history of COPD who comes in with concerns for shortness of breath and cough for the past week.  Patient was started on antibiotics, prednisone, inhaler on 10/1.  However she continues to have shortness of breath.  She does currently smoke.  I reviewed the notes were patient was placed on albuterol, prednisone, Augmentin.  This was on 10/1.  She did have COVID testing that was negative.  Patient reports that she typically runs 98-9 9% on room air.  She was 87% in triage after they placed the oxygen on her she states she felt much better.  She denies any chest pain or shortness of breath after oxygen was placed.  She states that she has had a cough for the past week.  She denies any swelling her legs,   Physical Exam   Triage Vital Signs: ED Triage Vitals  Encounter Vitals Group     BP 12/16/22 1301 (!) 141/73     Systolic BP Percentile --      Diastolic BP Percentile --      Pulse Rate 12/16/22 1301 80     Resp 12/16/22 1301 18     Temp 12/16/22 1301 98.9 F (37.2 C)     Temp Source 12/16/22 1301 Oral     SpO2 12/16/22 1301 (!) 87 %     Weight 12/16/22 1301 110 lb (49.9 kg)     Height 12/16/22 1301 5' (1.524 m)     Head Circumference --      Peak Flow --      Pain Score 12/16/22 1258 0     Pain Loc --      Pain Education --      Exclude from Growth Chart --     Most recent vital signs: Vitals:   12/16/22 1301  BP: (!) 141/73  Pulse: 80  Resp: 18  Temp: 98.9 F (37.2 C)  SpO2: (!) 87%     General: Awake, no distress.  CV:  Good peripheral perfusion.  Resp:  Normal effort.  Wheezing bilaterally. Abd:  No distention.  Other:  Swelling in legs.  No calf tenderness   ED Results / Procedures / Treatments   Labs (all  labs ordered are listed, but only abnormal results are displayed) Labs Reviewed  BASIC METABOLIC PANEL - Abnormal; Notable for the following components:      Result Value   Glucose, Bld 179 (*)    All other components within normal limits  SARS CORONAVIRUS 2 BY RT PCR  CBC WITH DIFFERENTIAL/PLATELET  CBC WITH DIFFERENTIAL/PLATELET     EKG  My interpretation of EKG:  Normal sinus rhythm 73 without any ST elevation or T wave inversions, normal intervals  RADIOLOGY I have reviewed the xray personally and interpreted no evidence of any pneumonia  PROCEDURES:  Critical Care performed: Yes, see critical care procedure note(s)  .1-3 Lead EKG Interpretation  Performed by: Concha Se, MD Authorized by: Concha Se, MD     Interpretation: normal     ECG rate:  60   ECG rate assessment: normal     Rhythm: sinus rhythm     Ectopy: none     Conduction: normal   .Critical Care  Performed by: Concha Se, MD Authorized by: Concha Se, MD   Critical care provider statement:    Critical care time (minutes):  30   Critical care was necessary to treat or prevent imminent or life-threatening deterioration of the following conditions:  Respiratory failure   Critical care was time spent personally by me on the following activities:  Development of treatment plan with patient or surrogate, discussions with consultants, evaluation of patient's response to treatment, examination of patient, ordering and review of laboratory studies, ordering and review of radiographic studies, ordering and performing treatments and interventions, pulse oximetry, re-evaluation of patient's condition and review of old charts    MEDICATIONS ORDERED IN ED: Medications - No data to display   IMPRESSION / MDM / ASSESSMENT AND PLAN / ED COURSE  I reviewed the triage vital signs and the nursing notes.   Patient's presentation is most consistent with severe exacerbation of chronic illness.   Patient  comes in with continued shortness of breath.  BMP ordered which is reassuring.  COVID test was negative.  This is most consistent with COPD given her wheezing.  No signs of DVT on examination and denies any history of blood clots and given her wheezing seems less likely however if patient does not improve may need to consider CT imaging.  She has had prior CT imaging in April 2024 that was negative for any lung cancer but does have emphysema.  I have given her some IV steroids, IV antibiotics, DuoNebs to see if this helps improve symptoms but at this time she is seems much more comfortable on 2 L of oxygen  The patient is on the cardiac monitor to evaluate for evidence of arrhythmia and/or significant heart rate changes.      FINAL CLINICAL IMPRESSION(S) / ED DIAGNOSES   Final diagnoses:  Acute respiratory failure with hypoxia (HCC)  COPD exacerbation (HCC)     Rx / DC Orders   ED Discharge Orders     None        Note:  This document was prepared using Dragon voice recognition software and may include unintentional dictation errors.   Concha Se, MD 12/16/22 1556

## 2022-12-16 NOTE — Assessment & Plan Note (Signed)
-   Lorazepam 0.5 mg p.o. every 6 hours as needed for anxiety, 2 doses ordered 

## 2022-12-16 NOTE — Hospital Course (Addendum)
Ms. Amanda Nelson is a 72 year old female with history of hypertension, hyperlipidemia, neuropathy, who presents emergency department for chief concerns of cough and shortness of breath for about 1 week. She was hypoxemic on arrival, had a significant respite distress and wheezing.  Chest x-ray showed right middle lobe pneumonia.  She was started on IV antibiotics, she was also given steroids.   CT scan of the chest showed dense consolidation in the right middle lobe, no mass appreciated.  Patient condition has improved, short of breath resolved.  Medically stable for discharge. Patient has received Augmentin before admission for 4 days, she has completed 2 days of IV antibiotics, her procalcitonin level is less than 0.1, I will continue Zithromax for additional 3 days for atypical coverage.

## 2022-12-16 NOTE — ED Notes (Signed)
O2 sats 87%-89% RA in triage, placed on [place]@2L 

## 2022-12-16 NOTE — Assessment & Plan Note (Signed)
-   Rosuvastatin 20 mg daily resumed 

## 2022-12-17 ENCOUNTER — Inpatient Hospital Stay: Payer: Medicare HMO

## 2022-12-17 DIAGNOSIS — J189 Pneumonia, unspecified organism: Secondary | ICD-10-CM

## 2022-12-17 DIAGNOSIS — J9601 Acute respiratory failure with hypoxia: Secondary | ICD-10-CM

## 2022-12-17 DIAGNOSIS — J441 Chronic obstructive pulmonary disease with (acute) exacerbation: Secondary | ICD-10-CM | POA: Diagnosis not present

## 2022-12-17 LAB — LACTIC ACID, PLASMA: Lactic Acid, Venous: 1.3 mmol/L (ref 0.5–1.9)

## 2022-12-17 LAB — BASIC METABOLIC PANEL
Anion gap: 9 (ref 5–15)
BUN: 9 mg/dL (ref 8–23)
CO2: 25 mmol/L (ref 22–32)
Calcium: 8.8 mg/dL — ABNORMAL LOW (ref 8.9–10.3)
Chloride: 106 mmol/L (ref 98–111)
Creatinine, Ser: 0.48 mg/dL (ref 0.44–1.00)
GFR, Estimated: >60 mL/min (ref >60)
Glucose, Bld: 141 mg/dL — ABNORMAL HIGH (ref 70–99)
Potassium: 4.1 mmol/L (ref 3.5–5.1)
Sodium: 140 mmol/L (ref 135–145)

## 2022-12-17 LAB — CBC
HCT: 35.9 % — ABNORMAL LOW (ref 36.0–46.0)
Hemoglobin: 12.2 g/dL (ref 12.0–15.0)
MCH: 31.1 pg (ref 26.0–34.0)
MCHC: 34 g/dL (ref 30.0–36.0)
MCV: 91.6 fL (ref 80.0–100.0)
Platelets: 229 10*3/uL (ref 150–400)
RBC: 3.92 MIL/uL (ref 3.87–5.11)
RDW: 13.4 % (ref 11.5–15.5)
WBC: 11.6 10*3/uL — ABNORMAL HIGH (ref 4.0–10.5)
nRBC: 0 % (ref 0.0–0.2)

## 2022-12-17 MED ORDER — PREDNISONE 20 MG PO TABS
40.0000 mg | ORAL_TABLET | Freq: Every day | ORAL | Status: DC
  Administered 2022-12-17 – 2022-12-18 (×2): 40 mg via ORAL
  Filled 2022-12-17 (×2): qty 2

## 2022-12-17 MED ORDER — FLUTICASONE FUROATE-VILANTEROL 200-25 MCG/ACT IN AEPB
1.0000 | INHALATION_SPRAY | Freq: Every day | RESPIRATORY_TRACT | Status: DC
  Administered 2022-12-17 – 2022-12-18 (×2): 1 via RESPIRATORY_TRACT
  Filled 2022-12-17: qty 28

## 2022-12-17 NOTE — Plan of Care (Signed)
  Problem: Clinical Measurements: Goal: Respiratory complications will improve Outcome: Progressing   Problem: Coping: Goal: Level of anxiety will decrease Outcome: Progressing   

## 2022-12-17 NOTE — Progress Notes (Signed)
Progress Note   Patient: Amanda Nelson ZDG:[id number] DOB: 10-06-1950 DOA: 12/16/2022     1 DOS: the patient was seen and examined on 12/17/2022   Brief hospital course: Ms. Amanda Nelson is a 72 year old female with history of hypertension, hyperlipidemia, neuropathy, who presents emergency department for chief concerns of cough and shortness of breath for about 1 week. She was hypoxemic on arrival, had a significant respite distress and wheezing.  Chest x-ray showed right middle lobe pneumonia.  She was started on IV antibiotics, she was also given steroids.      Principal Problem:   Acute hypoxemic respiratory failure (HCC) Active Problems:   Tobacco use   Anxiety disorder   Essential hypertension   Pure hypercholesterolemia   COPD with acute exacerbation (HCC)   Right middle lobe pneumonia   Cough   Stress incontinence in female   Increased lactic acid level   Assessment and Plan: * Acute hypoxemic respiratory failure (HCC) Right middle lobe pneumonia. COPD exacerbation. Patient has worsening short of breath and cough for 1 week, chest x-ray showed a right middle lobe pneumonia.  Procalcitonin level less than 0.1.  Reviewed record, patient had a cancer screening chest CT scan performed/12/2022, showed stable lung nodule.  Given new onset of right middle lobe pneumonia with normal procalcitonin level, I will obtain a CT scan to rule out obstructive lesions. Continue antibiotics with Rocephin and Zithromax. Patient also had significant bronchospasm with thick mucus, she has COPD exacerbation.  Continue steroids, added scheduled bronchodilator.  Lactic acidosis. Sepsis ruled out. Patient had a significant lactic acidosis at time of admission of 4.0.  Patient did not meet sepsis criteria.  Condition improved after fluids.  This could be due to significant hypoxia.  Stress incontinence in female Outpatient follow-up with PCP.  Dyslipidemia. Rosuvastatin 20 mg daily  resumed  Essential hypertension Continue lisinopril, amlodipine.  Anxiety disorder Follow  Tobacco use As needed nicotine patch ordered Patient is not ready to quit smoking       Subjective:  Patient still has significant wheezing and shortness of breath.  Very thick mucus.  Physical Exam: Vitals:   12/16/22 2023 12/16/22 2050 12/17/22 0436 12/17/22 0824  BP: 136/66  130/86 (!) 141/85  Pulse: 80  72 61  Resp: 20  16 17   Temp: 98.1 F (36.7 C)  98.1 F (36.7 C) 98 F (36.7 C)  TempSrc: Oral  Oral Oral  SpO2: 91% 91% 94% 93%  Weight:      Height:       General exam: Appears calm and comfortable  Respiratory system: Creased breathing sounds with wheezes. Respiratory effort normal. Cardiovascular system: S1 & S2 heard, RRR. No JVD, murmurs, rubs, gallops or clicks. No pedal edema. Gastrointestinal system: Abdomen is nondistended, soft and nontender. No organomegaly or masses felt. Normal bowel sounds heard. Central nervous system: Alert and oriented. No focal neurological deficits. Extremities: Symmetric 5 x 5 power. Skin: No rashes, lesions or ulcers Psychiatry: Judgement and insight appear normal. Mood & affect appropriate.    Data Reviewed:  Chest x-ray and lab results reviewed.  Family Communication: Husband updated over the phone.  Disposition: Status is: Inpatient Remains inpatient appropriate because: Severity of disease, IV treatment.     Time spent: 35 minutes  Author: Marrion Coy, MD 12/17/2022 10:55 AM  For on call review [email or web address].

## 2022-12-18 DIAGNOSIS — J189 Pneumonia, unspecified organism: Secondary | ICD-10-CM | POA: Diagnosis not present

## 2022-12-18 DIAGNOSIS — J9601 Acute respiratory failure with hypoxia: Secondary | ICD-10-CM | POA: Diagnosis not present

## 2022-12-18 DIAGNOSIS — J441 Chronic obstructive pulmonary disease with (acute) exacerbation: Secondary | ICD-10-CM | POA: Diagnosis not present

## 2022-12-18 MED ORDER — AZITHROMYCIN 250 MG PO TABS: 500.0000 mg | ORAL_TABLET | Freq: Every day | ORAL | Status: DC

## 2022-12-18 MED ORDER — FLUCONAZOLE 100 MG PO TABS: 200.0000 mg | ORAL_TABLET | Freq: Every day | ORAL | 0 refills | Status: AC

## 2022-12-18 MED ORDER — NICOTINE 21 MG/24HR TD PT24: 21.0000 mg | MEDICATED_PATCH | Freq: Every day | TRANSDERMAL | 0 refills | Status: DC | PRN

## 2022-12-18 MED ORDER — AZITHROMYCIN 250 MG PO TABS: 500.0000 mg | ORAL_TABLET | Freq: Every day | ORAL | 0 refills | Status: AC

## 2022-12-18 MED ORDER — SYMBICORT 80-4.5 MCG/ACT IN AERO: 2.0000 | INHALATION_SPRAY | Freq: Every day | RESPIRATORY_TRACT | 0 refills | Status: DC

## 2022-12-18 MED ORDER — PREDNISONE 20 MG PO TABS: ORAL_TABLET | ORAL | 0 refills | Status: AC

## 2022-12-18 NOTE — TOC CM/SW Note (Signed)
Transition of Care [facility]) - Inpatient Brief Assessment   Patient Details  Name: MAHAYLA HADDAWAY MRN: [id number] Date of Birth: [birth year]  [facility]) CM/SW Contact:    Chapman Fitch, RN Phone Number: 12/18/2022, 10:03 AM   Clinical Narrative:   Transition of Care [facility]) Screening Note   Patient Details  Name: TUWANA KAPAUN Date of Birth: 1950-08-21   Transition of Care [facility]) CM/SW Contact:    Chapman Fitch, RN Phone Number: 12/18/2022, 10:03 AM    Transition of Care Department [facility]) has reviewed patient and no TOC needs have been identified at this time.If new patient transition needs arise, please place a TOC consult.    Transition of Care Asessment: Insurance and Status: Insurance coverage has been reviewed Patient has primary care physician: Yes     Prior/Current Home Services: No current home services Social Determinants of Health Reivew: SDOH reviewed no interventions necessary Readmission risk has been reviewed: Yes Transition of care needs: no transition of care needs at this time

## 2022-12-18 NOTE — Progress Notes (Signed)
PHARMACIST - PHYSICIAN COMMUNICATION  CONCERNING: Antibiotic IV to Oral Route Change Policy  RECOMMENDATION: This patient is receiving azithromycin by the intravenous route.  Based on criteria approved by the Pharmacy and Therapeutics Committee, the antibiotic(s) is/are being converted to the equivalent oral dose form(s).  DESCRIPTION: These criteria include: Patient being treated for a respiratory tract infection, urinary tract infection, cellulitis or clostridium difficile associated diarrhea if on metronidazole The patient is not neutropenic and does not exhibit a GI malabsorption state The patient is eating (either orally or via tube) and/or has been taking other orally administered medications for a least 24 hours The patient is improving clinically and has a Tmax < 100.5  If you have questions about this conversion, please contact the Pharmacy Department   Tressie Ellis 12/18/22

## 2022-12-18 NOTE — Progress Notes (Signed)
Discharge instructions reviewed with patient including followup visits and new medications.  Understanding was verbalized and all questions were answered.  IV removed without complication; patient tolerated well.  Patient discharged home via wheelchair in stable condition escorted by volunteer staff.

## 2022-12-18 NOTE — Plan of Care (Signed)

## 2022-12-18 NOTE — Discharge Summary (Signed)
Physician Discharge Summary   Patient: Amanda Nelson MRN: [id number] DOB: [birth year]  Admit date:     12/16/2022  Discharge date: 12/18/22  Discharge Physician: Marrion Coy   PCP: Jacky Kindle, FNP   Recommendations at discharge:   Follow-up with PCP in 1 week.  Discharge Diagnoses: Principal Problem:   Acute hypoxemic respiratory failure (HCC) Active Problems:   Tobacco use   Anxiety disorder   Essential hypertension   Pure hypercholesterolemia   COPD with acute exacerbation (HCC)   Right middle lobe pneumonia   Cough   Stress incontinence in female   Increased lactic acid level  Resolved Problems:   * No resolved hospital problems. [facility] Course: Amanda Nelson is a 72 year old female with history of hypertension, hyperlipidemia, neuropathy, who presents emergency department for chief concerns of cough and shortness of breath for about 1 week. She was hypoxemic on arrival, had a significant respite distress and wheezing.  Chest x-ray showed right middle lobe pneumonia.  She was started on IV antibiotics, she was also given steroids.   CT scan of the chest showed dense consolidation in the right middle lobe, no mass appreciated.  Patient condition has improved, short of breath resolved.  Medically stable for discharge. Patient has received Augmentin before admission for 4 days, she has completed 2 days of IV antibiotics, her procalcitonin level is less than 0.1, I will continue Zithromax for additional 3 days for atypical coverage.  Assessment and Plan: * Acute hypoxemic respiratory failure (HCC) Right middle lobe pneumonia. COPD exacerbation. Patient has worsening short of breath and cough for 1 week, chest x-ray showed a right middle lobe pneumonia.  Procalcitonin level less than 0.1.  Reviewed record, patient had a cancer screening chest CT scan performed/12/2022, showed stable lung nodule.  Given new onset of right middle lobe pneumonia with normal  procalcitonin level, obtain a CT scan of the chest, showed a dense right middle lobe consolidation and a right lower lobe groundglass changes.  This reflect atypical bacterial pneumonia. Treated with antibiotics with Rocephin and Zithromax. Patient also had significant bronchospasm with thick mucus, she has COPD exacerbation.  Continued steroids, added scheduled bronchodilator. Condition finally improved, back to baseline, off oxygen.  Medically stable for discharge.  Will continue 3 more days Zithromax.    Lactic acidosis. Sepsis ruled out. Patient had a significant lactic acidosis at time of admission of 4.0.  Patient did not meet sepsis criteria.  Condition improved after fluids.  This could be due to significant hypoxia.   Stress incontinence in female Outpatient follow-up with PCP.   Dyslipidemia. Rosuvastatin 20 mg daily resumed   Essential hypertension Continue lisinopril, amlodipine.   Anxiety disorder Follow   Tobacco use As needed nicotine patch ordered Cessation of cigarette smoking strongly urged.       Consultants: None Procedures performed: None  Disposition: Home Diet recommendation:  Discharge Diet Orders (From admission, onward)     Start     Ordered   12/18/22 0000  Diet - low sodium heart healthy        12/18/22 1001           Cardiac diet DISCHARGE MEDICATION: Allergies as of 12/18/2022   No Known Allergies      Medication List     STOP taking these medications    amoxicillin-clavulanate 875-125 MG tablet Commonly known as: AUGMENTIN   gabapentin 100 MG capsule Commonly known as: NEURONTIN       TAKE these medications  albuterol 108 (90 Base) MCG/ACT inhaler Commonly known as: VENTOLIN HFA Inhale 2 puffs into the lungs every 4 (four) hours as needed for wheezing or shortness of breath.   amLODipine 5 MG tablet Commonly known as: Norvasc Take 1 tablet (5 mg total) by mouth daily.   azithromycin 250 MG tablet Commonly  known as: ZITHROMAX Take 2 tablets (500 mg total) by mouth daily for 3 days.   lisinopril 10 MG tablet Commonly known as: ZESTRIL Take 1 tablet (10 mg total) by mouth daily.   nicotine 21 mg/24hr patch Commonly known as: NICODERM CQ - dosed in mg/24 hours Place 1 patch (21 mg total) onto the skin daily as needed (nicotine craving).   predniSONE 20 MG tablet Commonly known as: DELTASONE Take 1 tablet (20 mg total) by mouth daily with breakfast for 3 days, THEN 0.5 tablets (10 mg total) daily with breakfast for 3 days. Start taking on: December 18, 2022 What changed:  medication strength See the new instructions.   promethazine-dextromethorphan 6.25-15 MG/5ML syrup Commonly known as: PROMETHAZINE-DM Take 5 mLs by mouth every 6 (six) hours as needed for cough.   rosuvastatin 20 MG tablet Commonly known as: Crestor Take 1 tablet (20 mg total) by mouth daily.   Symbicort 80-4.5 MCG/ACT inhaler Generic drug: budesonide-formoterol Inhale 2 puffs into the lungs daily.        Follow-up Information     Merita Norton T, FNP Follow up in 1 week(s).   Specialty: Family Medicine Contact information: [address] [place] [address] [phone]                Discharge Exam: Ceasar Mons Weights   12/16/22 1301  Weight: 49.9 kg   General exam: Appears calm and comfortable  Respiratory system: Significant decreased breathing sounds. Respiratory effort normal. Cardiovascular system: S1 & S2 heard, RRR. No JVD, murmurs, rubs, gallops or clicks. No pedal edema. Gastrointestinal system: Abdomen is nondistended, soft and nontender. No organomegaly or masses felt. Normal bowel sounds heard. Central nervous system: Alert and oriented. No focal neurological deficits. Extremities: Symmetric 5 x 5 power. Skin: No rashes, lesions or ulcers Psychiatry: Judgement and insight appear normal. Mood & affect appropriate.    Condition at discharge: good  The results of significant  diagnostics from this hospitalization (including imaging, microbiology, ancillary and laboratory) are listed below for reference.   Imaging Studies: CT CHEST WO CONTRAST  Result Date: 12/17/2022 CLINICAL DATA:  Pneumonia cough, shortness of breath EXAM: CT CHEST WITHOUT CONTRAST TECHNIQUE: Multidetector CT imaging of the chest was performed following the standard protocol without IV contrast. RADIATION DOSE REDUCTION: This exam was performed according to the departmental dose-optimization program which includes automated exposure control, adjustment of the mA and/or kV according to patient size and/or use of iterative reconstruction technique. COMPARISON:  06/20/2022 FINDINGS: Cardiovascular: Aortic atherosclerosis. Normal heart size. No pericardial effusion. Mediastinum/Nodes: No enlarged mediastinal, hilar, or axillary lymph nodes. Small hiatal hernia. Thyroid gland, trachea, and esophagus demonstrate no significant findings. Lungs/Pleura: Minimal paraseptal emphysema. Diffuse bilateral bronchial wall thickening. Dense consolidation of the medial segment right middle lobe and to a lesser extent the lingula (series 4, image 96). Scattered ground-glass airspace opacities, particularly in the right lower lobe. No pleural effusion or pneumothorax. Upper Abdomen: No acute abnormality. Musculoskeletal: No chest wall abnormality. No acute osseous findings. IMPRESSION: 1. Dense consolidation of the medial segment right middle lobe and to a lesser extent the lingula. Scattered ground-glass airspace opacities, particularly in the right lower lobe. Findings  are consistent with multifocal infection. 2. Diffuse bilateral bronchial wall thickening, consistent with nonspecific infectious or inflammatory bronchitis. 3. Minimal emphysema. 4. Small hiatal hernia. Aortic Atherosclerosis (ICD10-I70.0) and Emphysema (ICD10-J43.9). Electronically Signed   By: Jearld Lesch M.D.   On: 12/17/2022 13:50   DG Chest 2 View  Result  Date: 12/16/2022 CLINICAL DATA:  Cough and shortness of breath. EXAM: CHEST - 2 VIEW COMPARISON:  Chest CT 06/20/2022 FINDINGS: Right middle lobe airspace disease typical of pneumonia. Lesser patchy opacity at the left lung base. Mild chronic hyperinflation and bronchial thickening. The heart is normal in size. Aortic atherosclerosis. No pleural effusion or pneumothorax. Thoracic spondylosis. IMPRESSION: Right middle lobe airspace disease typical of pneumonia. Lesser patchy opacity at the left lung base may be atelectasis or pneumonia. Followup PA and lateral chest X-ray is recommended in 3-4 weeks following trial of antibiotic therapy to ensure resolution and exclude underlying malignancy. Electronically Signed   By: Narda Rutherford M.D.   On: 12/16/2022 15:01    Microbiology: Results for orders placed or performed during the hospital encounter of 12/16/22  SARS Coronavirus 2 by RT PCR (hospital order, performed in [facility] hospital lab) *cepheid single result test* Anterior Nasal Swab     Status: None   Collection Time: 12/16/22  1:09 PM   Specimen: Anterior Nasal Swab  Result Value Ref Range Status   SARS Coronavirus 2 by RT PCR NEGATIVE NEGATIVE Final    Comment: (NOTE) SARS-CoV-2 target nucleic acids are NOT DETECTED.  The SARS-CoV-2 RNA is generally detectable in upper and lower respiratory specimens during the acute phase of infection. The lowest concentration of SARS-CoV-2 viral copies this assay can detect is 250 copies / mL. A negative result does not preclude SARS-CoV-2 infection and should not be used as the sole basis for treatment or other patient management decisions.  A negative result may occur with improper specimen collection / handling, submission of specimen other than nasopharyngeal swab, presence of viral mutation(s) within the areas targeted by this assay, and inadequate number of viral copies (<250 copies / mL). A negative result must be combined with  clinical observations, patient history, and epidemiological information.  Fact Sheet for Patients:   RoadLapTop.co.za  Fact Sheet for Healthcare Providers: http://kim-miller.com/  This test is not yet approved or  cleared by the [place] FDA and has been authorized for detection and/or diagnosis of SARS-CoV-2 by FDA under an Emergency Use Authorization (EUA).  This EUA will remain in effect (meaning this test can be used) for the duration of the COVID-19 declaration under Section 564(b)(1) of the Act, 21 U.S.C. section 360bbb-3(b)(1), unless the authorization is terminated or revoked sooner.  Performed at [facility], [address] [place], [place] [address]     Labs: CBC: Recent Labs  Lab 12/16/22 1439 12/17/22 0347  WBC 10.4 11.6*  NEUTROABS 8.6*  --   HGB 15.2* 12.2  HCT 45.1 35.9*  MCV 93.4 91.6  PLT 258 229   Basic Metabolic Panel: Recent Labs  Lab 12/16/22 1309 12/17/22 0347  NA 136 140  K 4.0 4.1  CL 101 106  CO2 23 25  GLUCOSE 179* 141*  BUN 12 9  CREATININE 0.67 0.48  CALCIUM 9.1 8.8*   Liver Function Tests: No results for input(s): "AST", "ALT", "ALKPHOS", "BILITOT", "PROT", "ALBUMIN" in the last 168 hours. CBG: No results for input(s): "GLUCAP" in the last 168 hours.  Discharge time spent: greater than 30 minutes.  Signed: Marrion Coy, MD  [facility] 12/18/2022

## 2022-12-25 ENCOUNTER — Ambulatory Visit: Payer: Medicare HMO | Admitting: Family Medicine

## 2022-12-25 ENCOUNTER — Encounter: Payer: Self-pay | Admitting: Family Medicine

## 2022-12-25 ENCOUNTER — Inpatient Hospital Stay: Payer: Medicare HMO | Admitting: Family Medicine

## 2022-12-25 VITALS — BP 116/57 | HR 67 | Temp 97.9°F | Ht 60.0 in | Wt 111.0 lb

## 2022-12-25 DIAGNOSIS — G992 Myelopathy in diseases classified elsewhere: Secondary | ICD-10-CM | POA: Diagnosis not present

## 2022-12-25 DIAGNOSIS — M4802 Spinal stenosis, cervical region: Secondary | ICD-10-CM

## 2022-12-25 DIAGNOSIS — Z720 Tobacco use: Secondary | ICD-10-CM

## 2022-12-25 DIAGNOSIS — I10 Essential (primary) hypertension: Secondary | ICD-10-CM | POA: Diagnosis not present

## 2022-12-25 DIAGNOSIS — F17200 Nicotine dependence, unspecified, uncomplicated: Secondary | ICD-10-CM

## 2022-12-25 DIAGNOSIS — K760 Fatty (change of) liver, not elsewhere classified: Secondary | ICD-10-CM | POA: Diagnosis not present

## 2022-12-25 DIAGNOSIS — J432 Centrilobular emphysema: Secondary | ICD-10-CM

## 2022-12-25 MED ORDER — ROSUVASTATIN CALCIUM 20 MG PO TABS: 20.0000 mg | ORAL_TABLET | Freq: Every day | ORAL | 3 refills | Status: DC

## 2022-12-25 MED ORDER — LISINOPRIL 10 MG PO TABS
10.0000 mg | ORAL_TABLET | Freq: Every day | ORAL | 3 refills | Status: DC
Start: 2022-12-25 — End: 2023-10-15

## 2022-12-25 MED ORDER — AMLODIPINE BESYLATE 5 MG PO TABS
5.0000 mg | ORAL_TABLET | Freq: Every day | ORAL | 3 refills | Status: DC
Start: 2022-12-25 — End: 2023-10-15

## 2022-12-25 NOTE — Progress Notes (Signed)
Established patient visit   Patient: Amanda Nelson   DOB: 04-12-1950   72 y.o. Female  MRN: [id number] Visit Date: 12/25/2022  Today's healthcare provider: Jacky Kindle, FNP  Introduced to nurse practitioner role and practice setting.  All questions answered.  Discussed provider/patient relationship and expectations.  Subjective    HPI  Recently admitted to [facility] from 10/5-10/7 with acute hypoxic respiratory failure.  The patient, with a history of tobacco use and back pain, presents for follow-up after a recent hospitalization for bronchitis. They report feeling short of breath and requiring oxygen upon admission to the hospital. Since discharge, they initially felt 'rough' but have since improved and believe their oxygen levels have returned to normal. They were scared by this episode, as they have never experienced anything like it before. They attribute their illness to pushing themselves too hard and to potential lung damage from past tobacco use.  The patient also reports a productive cough, which has improved since their hospitalization. They have not been using the Symbicort prescribed to them due to concerns about side effects, and they do not typically need to use an albuterol inhaler. They do have an albuterol inhaler on hand in case of a flare-up.  In addition to their respiratory issues, the patient has been dealing with back pain and arm numbness, which have improved with exercises prescribed by a specialist. They have declined surgery for these issues.  Medications: Outpatient Medications Prior to Visit  Medication Sig   albuterol (VENTOLIN HFA) 108 (90 Base) MCG/ACT inhaler Inhale 2 puffs into the lungs every 4 (four) hours as needed for wheezing or shortness of breath.   amLODipine (NORVASC) 5 MG tablet Take 1 tablet (5 mg total) by mouth daily.   budesonide-formoterol (SYMBICORT) 80-4.5 MCG/ACT inhaler Inhale 2 puffs into the lungs daily.   lisinopril (ZESTRIL)  10 MG tablet Take 1 tablet (10 mg total) by mouth daily.   nicotine (NICODERM CQ - DOSED IN MG/24 HOURS) 21 mg/24hr patch Place 1 patch (21 mg total) onto the skin daily as needed (nicotine craving).   promethazine-dextromethorphan (PROMETHAZINE-DM) 6.25-15 MG/5ML syrup Take 5 mLs by mouth every 6 (six) hours as needed for cough.   rosuvastatin (CRESTOR) 20 MG tablet Take 1 tablet (20 mg total) by mouth daily.   No facility-administered medications prior to visit.    Review of Systems      Objective    There were no vitals taken for this visit.    Physical Exam Vitals and nursing note reviewed.  Constitutional:      General: She is not in acute distress.    Appearance: Normal appearance. She is normal weight. She is not ill-appearing, toxic-appearing or diaphoretic.  HENT:     Head: Normocephalic and atraumatic.  Cardiovascular:     Rate and Rhythm: Normal rate and regular rhythm.     Pulses: Normal pulses.     Heart sounds: Normal heart sounds. No murmur heard.    No friction rub. No gallop.  Pulmonary:     Effort: Pulmonary effort is normal. No respiratory distress.     Breath sounds: Normal breath sounds. No stridor. No wheezing, rhonchi or rales.  Chest:     Chest wall: No tenderness.  Musculoskeletal:        General: No swelling, tenderness, deformity or signs of injury. Normal range of motion.     Right lower leg: No edema.     Left lower leg: No edema.  Skin:  General: Skin is warm and dry.     Capillary Refill: Capillary refill takes less than 2 seconds.     Coloration: Skin is not jaundiced or pale.     Findings: No bruising, erythema, lesion or rash.  Neurological:     General: No focal deficit present.     Mental Status: She is alert and oriented to person, place, and time. Mental status is at baseline.     Cranial Nerves: No cranial nerve deficit.     Sensory: No sensory deficit.     Motor: No weakness.     Coordination: Coordination normal.   Psychiatric:        Mood and Affect: Mood normal.        Behavior: Behavior normal.        Thought Content: Thought content normal.        Judgment: Judgment normal.     No results found for any visits on 12/25/22.  Assessment & Plan    Acute Bronchitis requiring hospitalization for acute hypoxic resp failure Recent hospitalization for acute bronchitis with hypoxia. Currently improved with no fever, stable weight, and oxygen saturation of 97%. Cough productive of sputum, but improving. -Continue current management. -Remove Symbicort from medication list as patient has chosen not to use it due to side effects. -Continue to recommend smoking cessation.  Hypertension Stable, blood pressure within normal limits on current regimen of Amlodipine and Lisinopril. -Continue Amlodipine and Lisinopril. -Refill prescriptions to ensure continuity of medications.  Cervical Spinal Stenosis Improvement in symptoms with exercises provided by specialist. No current desire for surgical intervention. -Continue current management with exercises.  Follow-up in February for routine wellness visit.  Leilani Merl, FNP, have reviewed all documentation for this visit. The documentation on 12/25/22 for the exam, diagnosis, procedures, and orders are all accurate and complete.  Jacky Kindle, FNP  [facility] [phone] (phone) [phone] (fax)  [facility]

## 2022-12-29 ENCOUNTER — Other Ambulatory Visit: Payer: Self-pay | Admitting: Family Medicine

## 2022-12-29 DIAGNOSIS — I10 Essential (primary) hypertension: Secondary | ICD-10-CM

## 2022-12-29 NOTE — Telephone Encounter (Signed)
Requested Prescriptions  Refused Prescriptions Disp Refills   amLODipine (NORVASC) 5 MG tablet [Pharmacy Med Name: amLODIPine Besylate Oral Tablet 5 MG] 90 tablet 3    Sig: TAKE 1 TABLET (5 MG TOTAL) BY MOUTH DAILY.     Cardiovascular: Calcium Channel Blockers 2 Passed - 12/29/2022  4:44 AM      Passed - Last BP in normal range    BP Readings from Last 1 Encounters:  12/25/22 (!) 116/57         Passed - Last Heart Rate in normal range    Pulse Readings from Last 1 Encounters:  12/25/22 67         Passed - Valid encounter within last 6 months    Recent Outpatient Visits           4 days ago Centrilobular emphysema (HCC)   [facility] Jacky Kindle, FNP   8 months ago Encounter for subsequent annual wellness visit (AWV) in Medicare patient   [facility] Merita Norton T, FNP   10 months ago Centrilobular emphysema [facility])   [facility] Merita Norton T, FNP   1 year ago Sensation of pressure in bladder area   [facility] Jacky Kindle, FNP   1 year ago Encounter for subsequent annual wellness visit (AWV) in Medicare patient   [facility] Jacky Kindle, FNP       Future Appointments             In 3 months Jacky Kindle, FNP [facility]             rosuvastatin (CRESTOR) 20 MG tablet [Pharmacy Med Name: Rosuvastatin Calcium Oral Tablet 20 MG] 90 tablet 3    Sig: TAKE 1 TABLET (20 MG TOTAL) BY MOUTH DAILY.     Cardiovascular:  Antilipid - Statins 2 Failed - 12/29/2022  4:44 AM      Failed - Lipid Panel in normal range within the last 12 months    Cholesterol, Total  Date Value Ref Range Status  02/23/2022 175 100 - 199 mg/dL Final   LDL Chol Calc (NIH)  Date Value Ref Range Status  02/23/2022 64 0 - 99 mg/dL Final   HDL  Date Value Ref Range Status  02/23/2022 98 >39 mg/dL Final   Triglycerides   Date Value Ref Range Status  02/23/2022 67 0 - 149 mg/dL Final         Passed - Cr in normal range and within 360 days    Creatinine  Date Value Ref Range Status  07/02/2014 0.64 mg/dL Final    Comment:    6.57-8.46 NOTE: New Reference Range  05/19/14    Creatinine, Ser  Date Value Ref Range Status  12/17/2022 0.48 0.44 - 1.00 mg/dL Final         Passed - Patient is not pregnant      Passed - Valid encounter within last 12 months    Recent Outpatient Visits           4 days ago Centrilobular emphysema (HCC)   [facility] Jacky Kindle, FNP   8 months ago Encounter for subsequent annual wellness visit (AWV) in Medicare patient   [facility] Merita Norton T, FNP   10 months ago Centrilobular emphysema [facility])   [facility] Jacky Kindle, [place]  1 year ago Sensation of pressure in bladder area   [facility] Jacky Kindle, FNP   1 year ago Encounter for subsequent annual wellness visit (AWV) in Medicare patient   [facility] Jacky Kindle, FNP       Future Appointments             In 3 months Jacky Kindle, FNP [facility]             lisinopril (ZESTRIL) 10 MG tablet [Pharmacy Med Name: Lisinopril Oral Tablet 10 MG] 90 tablet 3    Sig: TAKE 1 TABLET (10 MG TOTAL) BY MOUTH DAILY.     Cardiovascular:  ACE Inhibitors Passed - 12/29/2022  4:44 AM      Passed - Cr in normal range and within 180 days    Creatinine  Date Value Ref Range Status  07/02/2014 0.64 mg/dL Final    Comment:    9.14-7.82 NOTE: New Reference Range  05/19/14    Creatinine, Ser  Date Value Ref Range Status  12/17/2022 0.48 0.44 - 1.00 mg/dL Final         Passed - K in normal range and within 180 days    Potassium  Date Value Ref Range Status  12/17/2022 4.1 3.5 - 5.1 mmol/L Final  07/02/2014 3.8 mmol/L Final     Comment:    3.5-5.1 NOTE: New Reference Range  05/19/14          Passed - Patient is not pregnant      Passed - Last BP in normal range    BP Readings from Last 1 Encounters:  12/25/22 (!) 116/57         Passed - Valid encounter within last 6 months    Recent Outpatient Visits           4 days ago Centrilobular emphysema (HCC)   [facility] Jacky Kindle, FNP   8 months ago Encounter for subsequent annual wellness visit (AWV) in Medicare patient   [facility] Merita Norton T, FNP   10 months ago Centrilobular emphysema [facility])   [facility] Merita Norton T, FNP   1 year ago Sensation of pressure in bladder area   [facility] Jacky Kindle, FNP   1 year ago Encounter for subsequent annual wellness visit (AWV) in Medicare patient   [facility] Jacky Kindle, FNP       Future Appointments             In 3 months Jacky Kindle, FNP [facility]

## 2023-03-31 DIAGNOSIS — J441 Chronic obstructive pulmonary disease with (acute) exacerbation: Secondary | ICD-10-CM | POA: Diagnosis not present

## 2023-03-31 DIAGNOSIS — Z03818 Encounter for observation for suspected exposure to other biological agents ruled out: Secondary | ICD-10-CM | POA: Diagnosis not present

## 2023-03-31 DIAGNOSIS — U071 COVID-19: Secondary | ICD-10-CM | POA: Diagnosis not present

## 2023-04-24 ENCOUNTER — Ambulatory Visit: Payer: Medicare HMO

## 2023-04-24 DIAGNOSIS — Z780 Asymptomatic menopausal state: Secondary | ICD-10-CM

## 2023-04-24 DIAGNOSIS — Z0000 Encounter for general adult medical examination without abnormal findings: Secondary | ICD-10-CM

## 2023-04-24 DIAGNOSIS — Z1231 Encounter for screening mammogram for malignant neoplasm of breast: Secondary | ICD-10-CM

## 2023-04-24 NOTE — Progress Notes (Addendum)
 Subjective:   Amanda Nelson is a 73 y.o. female who presents for Medicare Annual (Subsequent) preventive examination.  Visit Complete: Virtual I connected with  Georgiana Spinner on 04/24/23 by a audio enabled telemedicine application and verified that I am speaking with the correct person using two identifiers.  This patient declined Interactive audio and Acupuncturist. Therefore the visit was completed with audio only.   Patient Location: Home  Provider Location: Office/Clinic  I discussed the limitations of evaluation and management by telemedicine. The patient expressed understanding and agreed to proceed.  Vital Signs: Because this visit was a virtual/telehealth visit, some criteria may be missing or patient reported. Any vitals not documented were not able to be obtained and vitals that have been documented are patient reported.  Cardiac Risk Factors include: advanced age (>82men, >33 women);hypertension;sedentary lifestyle;smoking/ tobacco exposure     Objective:    There were no vitals filed for this visit. There is no height or weight on file to calculate BMI.     04/24/2023    2:39 PM 12/16/2022    1:04 PM 04/26/2022   11:12 AM 03/20/2019   10:04 AM 09/06/2017    9:33 AM 08/24/2016    9:18 AM  Advanced Directives  Does Patient Have a Medical Advance Directive? No No No No No No  Would patient like information on creating a medical advance directive? No - Patient declined No - Patient declined No - Patient declined No - Patient declined No - Patient declined     Current Medications (verified) Outpatient Encounter Medications as of 04/24/2023  Medication Sig   albuterol (VENTOLIN HFA) 108 (90 Base) MCG/ACT inhaler Inhale 2 puffs into the lungs every 4 (four) hours as needed for wheezing or shortness of breath.   amLODipine (NORVASC) 5 MG tablet Take 1 tablet (5 mg total) by mouth daily.   lisinopril (ZESTRIL) 10 MG tablet Take 1 tablet (10 mg total)  by mouth daily.   rosuvastatin (CRESTOR) 20 MG tablet Take 1 tablet (20 mg total) by mouth daily.   No facility-administered encounter medications on file as of 04/24/2023.    Allergies (verified) Patient has no known allergies.   History: Past Medical History:  Diagnosis Date   Anxiety    Arthritis    back and hands, left knee   HOH (hard of hearing)    Right ear   Hyperlipidemia    Hypertension    Numbness    right hand   Wears dentures    upper dentures   Past Surgical History:  Procedure Laterality Date   BUNIONECTOMY Left 04/26/2022   Procedure: BUNIONECTOMY - LAPIDUS;  Surgeon: Gwyneth Revels, DPM;  [facility];  Service: Podiatry;  Laterality: Left;   COLONOSCOPY     TUBAL LIGATION  03/14/1979   Family History  Problem Relation Age of Onset   Kidney cancer Sister    Cancer Sister        cancer   Stroke Brother    Prostate cancer Brother    Breast cancer Neg Hx    Social History   Socioeconomic History   Marital status: Married    Spouse name: Not on file   Number of children: 2   Years of education: Not on file   Highest education level: 12th grade  Occupational History   Occupation: retired  Tobacco Use   Smoking status: Every Day    Current packs/day: 0.50    Average packs/day: 0.5 packs/day for 39.0  years (19.5 ttl pk-yrs)    Types: Cigarettes   Smokeless tobacco: Never   Tobacco comments:    1/2 and 3/4 pack a day   Vaping Use   Vaping status: Never Used  Substance and Sexual Activity   Alcohol use: Not Currently   Drug use: Never   Sexual activity: Not Currently  Other Topics Concern   Not on file  Social History Narrative   Not on file   Social Drivers of Health   Financial Resource Strain: Low Risk  (04/24/2023)   Overall Financial Resource Strain (CARDIA)    Difficulty of Paying Living Expenses: Not hard at all  Food Insecurity: No Food Insecurity (04/24/2023)   Hunger Vital Sign    Worried About Running Out of  Food in the Last Year: Never true    Ran Out of Food in the Last Year: Never true  Transportation Needs: No Transportation Needs (04/24/2023)   PRAPARE - Administrator, Civil Service (Medical): No    Lack of Transportation (Non-Medical): No  Physical Activity: Inactive (04/24/2023)   Exercise Vital Sign    Days of Exercise per Week: 0 days    Minutes of Exercise per Session: 0 min  Stress: No Stress Concern Present (04/24/2023)   [employer] of Occupational Health - Occupational Stress Questionnaire    Feeling of Stress : Not at all  Social Connections: Moderately Integrated (04/24/2023)   Social Connection and Isolation Panel [NHANES]    Frequency of Communication with Friends and Family: More than three times a week    Frequency of Social Gatherings with Friends and Family: More than three times a week    Attends Religious Services: More than 4 times per year    Active Member of [employer] or Organizations: No    Attends Engineer, structural: Never    Marital Status: Married    Tobacco Counseling Ready to quit: Not Answered Counseling given: Not Answered Tobacco comments: 1/2 and 3/4 pack a day    Clinical Intake:  Pre-visit preparation completed: Yes  Pain : No/denies pain     BMI - recorded: 21.7 Nutritional Status: BMI of 19-24  Normal Nutritional Risks: None Diabetes: No  How often do you need to have someone help you when you read instructions, pamphlets, or other written materials from your doctor or pharmacy?: 1 - Never  Interpreter Needed?: No  Information entered by :: Kennedy Bucker, LPN   Activities of Daily Living    04/24/2023    2:40 PM 12/16/2022    5:17 PM  In your present state of health, do you have any difficulty performing the following activities:  Hearing? 1 0  Vision? 0 0  Difficulty concentrating or making decisions? 0 0  Walking or climbing stairs? 0   Dressing or bathing? 0   Doing errands, shopping? 0 0   Preparing Food and eating ? N   Using the Toilet? N   In the past six months, have you accidently leaked urine? N   Do you have problems with loss of bowel control? N   Managing your Medications? N   Managing your Finances? N   Housekeeping or managing your Housekeeping? N     No care team member to display  Indicate any recent Medical Services you may have received from other than [facility] providers in the past year (date may be approximate).     Assessment:   This is a routine wellness examination for [place].  Hearing/Vision  screen Hearing Screening - Comments:: NO AIDS, HAS HEARING LOSS IN RIGHT EAR Vision Screening - Comments:: READERS-    Goals Addressed             This Visit's Progress    DIET - EAT MORE FRUITS AND VEGETABLES         Depression Screen    04/24/2023    2:38 PM 12/25/2022    2:47 PM 04/18/2022   10:46 AM 02/23/2022    1:09 PM 04/12/2021   11:48 AM 02/17/2020    1:32 PM 03/20/2019   10:04 AM  PHQ 2/9 Scores  PHQ - 2 Score 0 0 0 0 0 0 0  PHQ- 9 Score 0 0 0 3 0      Fall Risk    04/24/2023    2:40 PM 12/25/2022    2:47 PM 04/18/2022   10:46 AM 02/23/2022    1:09 PM 04/12/2021   11:48 AM  Fall Risk   Falls in the past year? 0 0 0 0 0  Number falls in past yr: 0  0 0 0  Injury with Fall? 0  0 0 0  Risk for fall due to : No Fall Risks   No Fall Risks   Follow up Falls prevention discussed;Falls evaluation completed   Falls evaluation completed     MEDICARE RISK AT HOME: Medicare Risk at Home Any stairs in or around the home?: Yes If so, are there any without handrails?: Yes Home free of loose throw rugs in walkways, pet beds, electrical cords, etc?: Yes Adequate lighting in your home to reduce risk of falls?: Yes Life alert?: No Use of a cane, walker or w/c?: No Grab bars in the bathroom?: Yes Shower chair or bench in shower?: Yes Elevated toilet seat or a handicapped toilet?: No  TIMED UP AND GO:  Was the test performed?  No     Cognitive Function:        04/24/2023    2:43 PM 08/24/2016    9:21 AM  6CIT Screen  What Year? 0 points 0 points  What month? 0 points 0 points  What time? 0 points 0 points  Count back from 20 0 points 0 points  Months in reverse 0 points 0 points  Repeat phrase 0 points 10 points  Total Score 0 points 10 points    Immunizations Immunization History  Administered Date(s) Administered   PFIZER(Purple Top)SARS-COV-2 Vaccination 05/26/2019, 06/16/2019, 02/17/2020   PNEUMOCOCCAL CONJUGATE-20 04/12/2021   Pneumococcal Conjugate-13 09/05/2016   Tdap 09/05/2016    TDAP status: Up to date  Flu Vaccine status: Declined, Education has been provided regarding the importance of this vaccine but patient still declined. Advised may receive this vaccine at local pharmacy or Health Dept. Aware to provide a copy of the vaccination record if obtained from local pharmacy or Health Dept. Verbalized acceptance and understanding.  Pneumococcal vaccine status: Up to date  Covid-19 vaccine status: Completed vaccines  Qualifies for Shingles Vaccine? Yes   Zostavax completed No   Shingrix Completed?: No.    Education has been provided regarding the importance of this vaccine. Patient has been advised to call insurance company to determine out of pocket expense if they have not yet received this vaccine. Advised may also receive vaccine at local pharmacy or Health Dept. Verbalized acceptance and understanding.  Screening Tests Health Maintenance  Topic Date Due   Zoster Vaccines- Shingrix (1 of 2) Never done   COVID-19 Vaccine (4 - 2024-25  season) 11/12/2022   INFLUENZA VACCINE  06/11/2023 (Originally 10/12/2022)   Medicare Annual Wellness (AWV)  04/23/2024   MAMMOGRAM  06/13/2024   Colonoscopy  06/14/2025   DEXA SCAN  05/25/2026   DTaP/Tdap/Td (2 - Td or Tdap) 09/06/2026   Pneumonia Vaccine 64+ Years old  Completed   Hepatitis C Screening  Completed   HPV VACCINES  Aged Out   Lung Cancer  Screening  Discontinued    Health Maintenance  Health Maintenance Due  Topic Date Due   Zoster Vaccines- Shingrix (1 of 2) Never done   COVID-19 Vaccine (4 - 2024-25 season) 11/12/2022    Colorectal cancer screening: Type of screening: Colonoscopy. Completed 06/15/15. Repeat every 10 years  Mammogram status: Completed 06/14/22. Repeat every year  Bone Density status: Completed 05/24/21. Results reflect: Bone density results: OSTEOPOROSIS. Repeat every 2 years.  Lung Cancer Screening: (Low Dose CT Chest recommended if Age 40-80 years, 20 pack-year currently smoking OR have quit w/in 15years.) does qualify.   Lung Cancer Screening Referral: HAD CT SCAN 12/17/22  Additional Screening:  Hepatitis C Screening: does qualify; Completed 09/05/16  Vision Screening: Recommended annual ophthalmology exams for early detection of glaucoma and other disorders of the eye. Is the patient up to date with their annual eye exam?  No  Who is the provider or what is the name of the office in which the patient attends annual eye exams? NO ONE If pt is not established with a provider, would they like to be referred to a provider to establish care? No .   Dental Screening: Recommended annual dental exams for proper oral hygiene   Community Resource Referral / Chronic Care Management: CRR required this visit?  No   CCM required this visit?  No     Plan:     I have personally reviewed and noted the following in the patient's chart:   Medical and social history Use of alcohol, tobacco or illicit drugs  Current medications and supplements including opioid prescriptions. Patient is not currently taking opioid prescriptions. Functional ability and status Nutritional status Physical activity Advanced directives List of other physicians Hospitalizations, surgeries, and ER visits in previous 12 months Vitals Screenings to include cognitive, depression, and falls Referrals and appointments  In  addition, I have reviewed and discussed with patient certain preventive protocols, quality metrics, and best practice recommendations. A written personalized care plan for preventive services as well as general preventive health recommendations were provided to patient.     Hal Hope, LPN   06/19/8117   After Visit Summary: (MyChart) Due to this being a telephonic visit, the after visit summary with patients personalized plan was offered to patient via MyChart   Nurse Notes: REFERRALS SENT FOR BDS & MAMMOGRAM

## 2023-04-24 NOTE — Patient Instructions (Addendum)
Ms. Amanda Nelson , Thank you for taking time to come for your Medicare Wellness Visit. I appreciate your ongoing commitment to your health goals. Please review the following plan we discussed and let me know if I can assist you in the future.   Referrals/Orders/Follow-Ups/Clinician Recommendations: REFERRALS SENT FOR MAMMOGRAM & BONE DENSITY SCAN  You have an order for:  []   2D Mammogram  [x]   3D Mammogram  [x]   Bone Density     Please call for appointment:  [facility]  [address] [place] [address] [phone]   Make sure to wear two-piece clothing.  No lotions, powders, or deodorants the day of the appointment. Make sure to bring picture ID and insurance card.  Bring list of medications you are currently taking including any supplements.   Schedule your [place] screening mammogram through MyChart!   Log into your MyChart account.  Go to 'Visit' (or 'Appointments' if on mobile App) --> Schedule an Appointment  Under 'Select a Reason for Visit' choose the Mammogram Screening option.  Complete the pre-visit questions and select the time and place that best fits your schedule.    This is a list of the screening recommended for you and due dates:  Health Maintenance  Topic Date Due   Zoster (Shingles) Vaccine (1 of 2) Never done   COVID-19 Vaccine (4 - 2024-25 season) 11/12/2022   Flu Shot  06/11/2023*   Medicare Annual Wellness Visit  04/23/2024   Mammogram  06/13/2024   Colon Cancer Screening  06/14/2025   DEXA scan (bone density measurement)  05/25/2026   DTaP/Tdap/Td vaccine (2 - Td or Tdap) 09/06/2026   Pneumonia Vaccine  Completed   Hepatitis C Screening  Completed   HPV Vaccine  Aged Out   Screening for Lung Cancer  Discontinued  *Topic was postponed. The date shown is not the original due date.    Advanced directives: (ACP Link)Information on Advanced Care Planning can be found at [facility] Advance Health Care Directives Advance Health Care Directives ([email or web address]/)   Next Medicare Annual Wellness Visit scheduled for next year: Yes   04/29/24 @ 3:10 PM BY PHONE

## 2023-04-26 ENCOUNTER — Ambulatory Visit (INDEPENDENT_AMBULATORY_CARE_PROVIDER_SITE_OTHER): Payer: Medicare HMO | Admitting: Family Medicine

## 2023-04-26 ENCOUNTER — Encounter: Payer: Self-pay | Admitting: Family Medicine

## 2023-04-26 VITALS — BP 127/68 | HR 62 | Temp 97.8°F | Resp 16 | Ht 60.0 in | Wt 108.0 lb

## 2023-04-26 DIAGNOSIS — I10 Essential (primary) hypertension: Secondary | ICD-10-CM

## 2023-04-26 DIAGNOSIS — Z130 Encounter for screening for diseases of the blood and blood-forming organs and certain disorders involving the immune mechanism: Secondary | ICD-10-CM

## 2023-04-26 DIAGNOSIS — Z0001 Encounter for general adult medical examination with abnormal findings: Secondary | ICD-10-CM | POA: Diagnosis not present

## 2023-04-26 DIAGNOSIS — Z0000 Encounter for general adult medical examination without abnormal findings: Secondary | ICD-10-CM

## 2023-04-26 DIAGNOSIS — Z13220 Encounter for screening for lipoid disorders: Secondary | ICD-10-CM

## 2023-04-26 DIAGNOSIS — Z131 Encounter for screening for diabetes mellitus: Secondary | ICD-10-CM

## 2023-04-26 DIAGNOSIS — K5909 Other constipation: Secondary | ICD-10-CM | POA: Diagnosis not present

## 2023-04-26 DIAGNOSIS — F17200 Nicotine dependence, unspecified, uncomplicated: Secondary | ICD-10-CM

## 2023-04-26 DIAGNOSIS — K760 Fatty (change of) liver, not elsewhere classified: Secondary | ICD-10-CM | POA: Diagnosis not present

## 2023-04-26 NOTE — Assessment & Plan Note (Addendum)
Chronic conditions are stable  Patient was counseled on benefits of regular physical activity with goal of 150 minutes of moderate to vigurous intensity 4 days per week  Patient was counseled to consume well balanced diet of fruits, vegetables, limited saturated fats and limited sugary foods and beverages with emphasis on consuming 6-8 glasses of water daily  Screening recommended today: A1c, lipids,CMP,CBC  Mammogram 06/04/22    Lung CA screening CT: 12/17/22    Vaccines recommended today: COVID,Shingrix

## 2023-04-26 NOTE — Patient Instructions (Addendum)
Recommendation:   Laurita Quint  -COVID booster    [facility] [address] [place],  [place]  [address] Main: [phone]

## 2023-04-26 NOTE — Progress Notes (Signed)
Complete physical exam   Patient: Amanda Nelson   DOB: 12/19/1950   73 y.o. Female  MRN: [id number] Visit Date: 04/26/2023  Today's healthcare provider: Ronnald Ramp, MD   Chief Complaint  Patient presents with   Annual Exam    AWV 04/24/2023   Subjective    Amanda Nelson is a 73 y.o. female who presents today for a complete physical exam.   She reports consuming a general diet. Reports that she does not overeat nor snacker.   The patient does not participate in regular exercise at present. Reports she is very active.    She does not have additional problems to discuss today.   Discussed the use of AI scribe software for clinical note transcription with the patient, who gave verbal consent to proceed.  History of Present Illness   Amanda Nelson is a 73 year old female who presents for an annual physical exam.  She missed previous appointments but had her annual wellness visit on April 24, 2023, with a nurse health advisor. She is very active but does not engage in structured exercise. She does not eat breakfast, has lunch, and cooks dinner for her husband. 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  She had pneumonia in October and was prescribed albuterol, Norvasc, lisinopril, and Crestor. She does not use albuterol and requests it be removed from her medication list. She uses the blood pressure medications and Crestor regularly, with a year's worth of refills provided. She had COVID-19 a few weeks ago and has since recovered, although she transmitted it to her husband. She has received COVID-19 vaccinations and boosters in the past.  She has a history of tobacco dependence and acknowledges the recommendation to stop  smoking. She expresses fear of needles, which has led her to decline the shingles vaccine. She also mentions her son had complications from the shingles vaccine, which contributes to her hesitancy.       Past Medical History:  Diagnosis Date   Anxiety    Arthritis    back and hands, left knee   HOH (hard of hearing)    Right ear   Hyperlipidemia    Hypertension    Numbness    right hand   Wears dentures    upper dentures   Past Surgical History:  Procedure Laterality Date   BUNIONECTOMY Left 04/26/2022   Procedure: BUNIONECTOMY - LAPIDUS;  Surgeon: Gwyneth Revels, DPM;  Location: [facility];  Service: Podiatry;  Laterality: Left;   COLONOSCOPY     TUBAL LIGATION  03/14/1979   Social History   Socioeconomic History   Marital status: Married    Spouse name: Not on file   Number of children: 2   Years of education: Not on file   Highest education level: GED or equivalent  Occupational History   Occupation: retired  Tobacco Use   Smoking status: Every Day    Current packs/day: 0.50    Average packs/day: 0.5 packs/day for 39.0 years (19.5 ttl pk-yrs)    Types: Cigarettes   Smokeless tobacco: Never   Tobacco comments:    1/2 and 3/4 pack a day   Vaping Use   Vaping status: Never Used  Substance and Sexual Activity  Alcohol use: Yes   Drug use: No   Sexual activity: Not Currently  Other Topics Concern   Not on file  Social History Narrative   Not on file   Social Drivers of Health   Financial Resource Strain: Low Risk  (04/24/2023)   Overall Financial Resource Strain (CARDIA)    Difficulty of Paying Living Expenses: Not hard at all  Food Insecurity: No Food Insecurity (04/24/2023)   Hunger Vital Sign    Worried About Running Out of Food in the Last Year: Never true    Ran Out of Food in the Last Year: Never true  Transportation Needs: No Transportation Needs (04/24/2023)   PRAPARE - Administrator, Civil Service (Medical): No    Lack of  Transportation (Non-Medical): No  Physical Activity: Inactive (04/24/2023)   Exercise Vital Sign    Days of Exercise per Week: 0 days    Minutes of Exercise per Session: 0 min  Stress: No Stress Concern Present (04/24/2023)   [employer] of Occupational Health - Occupational Stress Questionnaire    Feeling of Stress : Not at all  Social Connections: Moderately Integrated (04/24/2023)   Social Connection and Isolation Panel [NHANES]    Frequency of Communication with Friends and Family: More than three times a week    Frequency of Social Gatherings with Friends and Family: More than three times a week    Attends Religious Services: More than 4 times per year    Active Member of [employer] or Organizations: No    Attends Banker Meetings: Never    Marital Status: Married  Catering manager Violence: Not At Risk (04/24/2023)   Humiliation, Afraid, Rape, and Kick questionnaire    Fear of Current or Ex-Partner: No    Emotionally Abused: No    Physically Abused: No    Sexually Abused: No   Family Status  Relation Name Status   Mother  Deceased at age 73       cause of death was child birth   Father  Deceased at age 20's       died from an MI   Sister 1 Alive   Sister 2 Alive   Sister 3 Alive   Sister 4 Deceased   Brother 1 Alive   Brother 2 Alive   Brother 3 Deceased   Sister jean Alive   Neg Hx  (Not Specified)  No partnership data on file   Family History  Problem Relation Age of Onset   Kidney cancer Sister    Cancer Sister        cancer   Stroke Brother    Prostate cancer Brother    Diabetes Brother    COPD Sister    Breast cancer Neg Hx    No Known Allergies   Medications: Outpatient Medications Prior to Visit  Medication Sig   amLODipine (NORVASC) 5 MG tablet Take 1 tablet (5 mg total) by mouth daily.   lisinopril (ZESTRIL) 10 MG tablet Take 1 tablet (10 mg total) by mouth daily.   rosuvastatin (CRESTOR) 20 MG tablet Take 1 tablet (20 mg total) by  mouth daily.   [DISCONTINUED] albuterol (VENTOLIN HFA) 108 (90 Base) MCG/ACT inhaler Inhale 2 puffs into the lungs every 4 (four) hours as needed for wheezing or shortness of breath.   No facility-administered medications prior to visit.    Review of Systems  Last CBC Lab Results  Component Value Date   WBC 11.6 (H) 12/17/2022  HGB 12.2 12/17/2022   HCT 35.9 (L) 12/17/2022   MCV 91.6 12/17/2022   MCH 31.1 12/17/2022   RDW 13.4 12/17/2022   PLT 229 12/17/2022   Last metabolic panel Lab Results  Component Value Date   GLUCOSE 141 (H) 12/17/2022   NA 140 12/17/2022   K 4.1 12/17/2022   CL 106 12/17/2022   CO2 25 12/17/2022   BUN 9 12/17/2022   CREATININE 0.48 12/17/2022   GFRNONAA >60 12/17/2022   CALCIUM 8.8 (L) 12/17/2022   PROT 6.9 04/12/2021   ALBUMIN 4.6 04/12/2021   LABGLOB 2.3 04/12/2021   AGRATIO 2.0 04/12/2021   BILITOT 0.5 04/12/2021   ALKPHOS 68 04/12/2021   AST 15 04/12/2021   ALT 11 04/12/2021   ANIONGAP 9 12/17/2022   Last lipids Lab Results  Component Value Date   CHOL 175 02/23/2022   HDL 98 02/23/2022   LDLCALC 64 02/23/2022   TRIG 67 02/23/2022   CHOLHDL 1.8 02/23/2022   Last hemoglobin A1c No results found for: "HGBA1C" Last thyroid functions Lab Results  Component Value Date   TSH 1.450 02/18/2020   Last vitamin D No results found for: "25OHVITD2", "25OHVITD3", "VD25OH" Last vitamin B12 and Folate No results found for: "VITAMINB12", "FOLATE"     Objective    BP 127/68 (BP Location: Left Arm, Patient Position: Sitting, Cuff Size: Small)   Pulse 62   Temp 97.8 F (36.6 C) (Oral)   Resp 16   Ht 5' (1.524 m)   Wt 108 lb (49 kg)   SpO2 100%   BMI 21.09 kg/m  BP Readings from Last 3 Encounters:  04/26/23 127/68  12/25/22 (!) 116/57  12/18/22 (!) 140/70   Wt Readings from Last 3 Encounters:  04/26/23 108 lb (49 kg)  12/25/22 111 lb (50.3 kg)  12/16/22 110 lb (49.9 kg)        Physical Exam Vitals reviewed.   Constitutional:      General: She is not in acute distress.    Appearance: Normal appearance. She is not ill-appearing, toxic-appearing or diaphoretic.  HENT:     Head: Normocephalic and atraumatic.     Right Ear: Tympanic membrane and external ear normal. There is no impacted cerumen.     Left Ear: Tympanic membrane and external ear normal. There is no impacted cerumen.     Nose: Nose normal.     Mouth/Throat:     Pharynx: Oropharynx is clear.  Eyes:     General: No scleral icterus.    Extraocular Movements: Extraocular movements intact.     Conjunctiva/sclera: Conjunctivae normal.     Pupils: Pupils are equal, round, and reactive to light.  Cardiovascular:     Rate and Rhythm: Normal rate and regular rhythm.     Pulses: Normal pulses.     Heart sounds: Normal heart sounds. No murmur heard.    No friction rub. No gallop.  Pulmonary:     Effort: Pulmonary effort is normal. No respiratory distress.     Breath sounds: Normal breath sounds. No wheezing, rhonchi or rales.  Abdominal:     General: Bowel sounds are normal. There is no distension.     Palpations: Abdomen is soft. There is no mass.     Tenderness: There is no abdominal tenderness. There is no guarding.  Musculoskeletal:        General: No deformity.     Cervical back: Normal range of motion and neck supple. No rigidity.     Right lower  leg: No edema.     Left lower leg: No edema.  Lymphadenopathy:     Cervical: No cervical adenopathy.  Skin:    General: Skin is warm.     Capillary Refill: Capillary refill takes less than 2 seconds.     Findings: No erythema or rash.  Neurological:     General: No focal deficit present.     Mental Status: She is alert and oriented to person, place, and time.     Motor: No weakness.     Gait: Gait normal.  Psychiatric:        Mood and Affect: Mood normal.        Behavior: Behavior normal.       Last depression screening scores    04/24/2023    2:38 PM 12/25/2022    2:47  PM 04/18/2022   10:46 AM  PHQ 2/9 Scores  PHQ - 2 Score 0 0 0  PHQ- 9 Score 0 0 0    Last fall risk screening    04/24/2023    2:40 PM  Fall Risk   Falls in the past year? 0  Number falls in past yr: 0  Injury with Fall? 0  Risk for fall due to : No Fall Risks  Follow up Falls prevention discussed;Falls evaluation completed    Last Audit-C alcohol use screening    04/24/2023    5:16 PM  Alcohol Use Disorder Test (AUDIT)  1. How often do you have a drink containing alcohol? 3  2. How many drinks containing alcohol do you have on a typical day when you are drinking? 0  3. How often do you have six or more drinks on one occasion? 0  AUDIT-C Score 3   4. How often during the last year have you found that you were not able to stop drinking once you had started? 0  5. How often during the last year have you failed to do what was normally expected from you because of drinking? 0  6. How often during the last year have you needed a first drink in the morning to get yourself going after a heavy drinking session? 0  7. How often during the last year have you had a feeling of guilt of remorse after drinking? 0  8. How often during the last year have you been unable to remember what happened the night before because you had been drinking? 0  9. Have you or someone else been injured as a result of your drinking? 0  10. Has a relative or friend or a doctor or another health worker been concerned about your drinking or suggested you cut down? 0  Alcohol Use Disorder Identification Test Final Score (AUDIT) 3      Patient-reported   A score of 3 or more in women, and 4 or more in men indicates increased risk for alcohol abuse, EXCEPT if all of the points are from question 1   No results found for any visits on 04/26/23.  Assessment & Plan    Routine Health Maintenance and Physical Exam  Immunization History  Administered Date(s) Administered   PFIZER(Purple Top)SARS-COV-2 Vaccination  05/26/2019, 06/16/2019, 02/17/2020   PNEUMOCOCCAL CONJUGATE-20 04/12/2021   Pneumococcal Conjugate-13 09/05/2016   Tdap 09/05/2016    Health Maintenance  Topic Date Due   COVID-19 Vaccine (4 - 2024-25 season) 11/12/2022   INFLUENZA VACCINE  06/11/2023 (Originally 10/12/2022)   Zoster Vaccines- Shingrix (1 of 2) 07/24/2023 (Originally 07/28/1969)   Medicare  Annual Wellness (AWV)  04/23/2024   MAMMOGRAM  06/13/2024   Colonoscopy  06/14/2025   DEXA SCAN  05/25/2026   DTaP/Tdap/Td (2 - Td or Tdap) 09/06/2026   Pneumonia Vaccine 60+ Years old  Completed   Hepatitis C Screening  Completed   HPV VACCINES  Aged Out   Lung Cancer Screening  Discontinued    Problem List Items Addressed This Visit       Cardiovascular and Mediastinum   Essential hypertension   Relevant Orders   CMP14+EGFR     Digestive   Fatty infiltration of liver   Relevant Orders   Hemoglobin A1c   CMP14+EGFR   Chronic constipation   Relevant Orders   CBC   CMP14+EGFR   TSH+T4F+T3Free     Other   Tobacco dependence   Relevant Orders   CBC   Annual physical exam - Primary   Chronic conditions are stable  Patient was counseled on benefits of regular physical activity with goal of 150 minutes of moderate to vigurous intensity 4 days per week  Patient was counseled to consume well balanced diet of fruits, vegetables, limited saturated fats and limited sugary foods and beverages with emphasis on consuming 6-8 glasses of water daily  Screening recommended today: A1c, lipids,CMP,CBC  Mammogram 06/04/22    Lung CA screening CT: 12/17/22    Vaccines recommended today: COVID,Shingrix      Other Visit Diagnoses       Screening for diabetes mellitus       Relevant Orders   Hemoglobin A1c     Screening for lipid disorders       Relevant Orders   Lipid panel     Screening for deficiency anemia       Relevant Orders   CBC          Annual Physical Exam 73 year old female presenting for an annual physical  exam. Missed previous appointments. No current exercise regimen but reports being very active. No significant weight changes. Blood pressure is well-controlled at 127/68 mmHg. - Order A1c, CBC, CMP, lipid panel, TSH, T4, and T3 - Recommend 150 minutes of moderate to vigorous physical activity weekly - Advise a well-balanced diet  Hypertension Hypertension managed with Norvasc and lisinopril. Blood pressure is well-controlled at 127/68 mmHg. - Continue Norvasc and lisinopril - Follow-up in 6 months  Hyperlipidemia Hyperlipidemia managed with Crestor. Lipid panel ordered for current assessment. - Continue Crestor - Order lipid panel  Tobacco Dependence Ongoing tobacco use. Advised to quit smoking for overall health improvement. - Recommend smoking cessation  General Health Maintenance Declined shingles vaccine due to fear of needles and concerns about complications. Discussed benefits and risks, including potential flu-like symptoms post-vaccination versus severe complications of shingles such as chronic pain and ocular complications. Recently recovered from COVID-19. Discussed importance of COVID-19 booster. - Provide information on shingles vaccine - Recommend COVID-19 booster - Encourage scheduling a mammogram after April 3rd - Recommend bone density scan    Return in about 6 months (around 10/24/2023) for HTN.      Ronnald Ramp, MD  [facility] [phone] (phone) [phone] (fax)  [facility]

## 2023-04-27 ENCOUNTER — Encounter: Payer: Self-pay | Admitting: Family Medicine

## 2023-04-27 LAB — LIPID PANEL
Chol/HDL Ratio: 2 ratio (ref 0.0–4.4)
Cholesterol, Total: 169 mg/dL (ref 100–199)
HDL: 84 mg/dL
LDL Chol Calc (NIH): 72 mg/dL (ref 0–99)
Triglycerides: 66 mg/dL (ref 0–149)
VLDL Cholesterol Cal: 13 mg/dL (ref 5–40)

## 2023-04-27 LAB — CBC
Hematocrit: 39.3 % (ref 34.0–46.6)
Hemoglobin: 13.3 g/dL (ref 11.1–15.9)
MCH: 32.9 pg (ref 26.6–33.0)
MCHC: 33.8 g/dL (ref 31.5–35.7)
MCV: 97 fL (ref 79–97)
Platelets: 171 10*3/uL (ref 150–450)
RBC: 4.04 x10E6/uL (ref 3.77–5.28)
RDW: 12.3 % (ref 11.7–15.4)
WBC: 6 10*3/uL (ref 3.4–10.8)

## 2023-04-27 LAB — CMP14+EGFR
ALT: 13 [IU]/L (ref 0–32)
AST: 17 [IU]/L (ref 0–40)
Albumin: 4.4 g/dL (ref 3.8–4.8)
Alkaline Phosphatase: 68 [IU]/L (ref 44–121)
BUN/Creatinine Ratio: 15 (ref 12–28)
BUN: 10 mg/dL (ref 8–27)
Bilirubin Total: 0.5 mg/dL (ref 0.0–1.2)
CO2: 21 mmol/L (ref 20–29)
Calcium: 9.5 mg/dL (ref 8.7–10.3)
Chloride: 102 mmol/L (ref 96–106)
Creatinine, Ser: 0.68 mg/dL (ref 0.57–1.00)
Globulin, Total: 2.3 g/dL (ref 1.5–4.5)
Glucose: 87 mg/dL (ref 70–99)
Potassium: 4.3 mmol/L (ref 3.5–5.2)
Sodium: 138 mmol/L (ref 134–144)
Total Protein: 6.7 g/dL (ref 6.0–8.5)
eGFR: 93 mL/min/{1.73_m2} (ref >59)

## 2023-04-27 LAB — HEMOGLOBIN A1C
Est. average glucose Bld gHb Est-mCnc: 123 mg/dL
Hgb A1c MFr Bld: 5.9 % — ABNORMAL HIGH (ref 4.8–5.6)

## 2023-04-27 LAB — TSH+T4F+T3FREE
Free T4: 1.27 ng/dL (ref 0.82–1.77)
T3, Free: 3 pg/mL (ref 2.0–4.4)
TSH: 1.44 u[IU]/mL (ref 0.450–4.500)

## 2023-05-09 DIAGNOSIS — D2261 Melanocytic nevi of right upper limb, including shoulder: Secondary | ICD-10-CM | POA: Diagnosis not present

## 2023-05-09 DIAGNOSIS — L578 Other skin changes due to chronic exposure to nonionizing radiation: Secondary | ICD-10-CM | POA: Diagnosis not present

## 2023-05-09 DIAGNOSIS — D2262 Melanocytic nevi of left upper limb, including shoulder: Secondary | ICD-10-CM | POA: Diagnosis not present

## 2023-05-09 DIAGNOSIS — D225 Melanocytic nevi of trunk: Secondary | ICD-10-CM | POA: Diagnosis not present

## 2023-05-09 DIAGNOSIS — L570 Actinic keratosis: Secondary | ICD-10-CM | POA: Diagnosis not present

## 2023-05-09 DIAGNOSIS — L821 Other seborrheic keratosis: Secondary | ICD-10-CM | POA: Diagnosis not present

## 2023-05-09 DIAGNOSIS — B078 Other viral warts: Secondary | ICD-10-CM | POA: Diagnosis not present

## 2023-05-09 DIAGNOSIS — L820 Inflamed seborrheic keratosis: Secondary | ICD-10-CM | POA: Diagnosis not present

## 2023-05-09 DIAGNOSIS — L538 Other specified erythematous conditions: Secondary | ICD-10-CM | POA: Diagnosis not present

## 2023-06-18 ENCOUNTER — Other Ambulatory Visit: Payer: Medicare HMO

## 2023-06-21 ENCOUNTER — Ambulatory Visit
Admission: RE | Admit: 2023-06-21 | Discharge: 2023-06-21 | Disposition: A | Discharge status: Home / Self Care | Source: Ambulatory Visit | Attending: Family Medicine | Admitting: Family Medicine

## 2023-06-21 DIAGNOSIS — F17210 Nicotine dependence, cigarettes, uncomplicated: Secondary | ICD-10-CM | POA: Insufficient documentation

## 2023-06-21 DIAGNOSIS — Z122 Encounter for screening for malignant neoplasm of respiratory organs: Secondary | ICD-10-CM | POA: Diagnosis not present

## 2023-06-21 DIAGNOSIS — Z87891 Personal history of nicotine dependence: Secondary | ICD-10-CM | POA: Diagnosis not present

## 2023-07-04 ENCOUNTER — Ambulatory Visit
Admission: RE | Admit: 2023-07-04 | Discharge: 2023-07-04 | Disposition: A | Discharge status: Home / Self Care | Source: Ambulatory Visit | Attending: Family Medicine | Admitting: Family Medicine

## 2023-07-04 DIAGNOSIS — Z1231 Encounter for screening mammogram for malignant neoplasm of breast: Secondary | ICD-10-CM | POA: Insufficient documentation

## 2023-07-04 DIAGNOSIS — Z780 Asymptomatic menopausal state: Secondary | ICD-10-CM | POA: Diagnosis not present

## 2023-07-04 DIAGNOSIS — M810 Age-related osteoporosis without current pathological fracture: Secondary | ICD-10-CM | POA: Diagnosis not present

## 2023-07-05 ENCOUNTER — Encounter: Payer: Self-pay | Admitting: Family Medicine

## 2023-07-25 ENCOUNTER — Other Ambulatory Visit: Payer: Self-pay

## 2023-07-25 DIAGNOSIS — Z122 Encounter for screening for malignant neoplasm of respiratory organs: Secondary | ICD-10-CM

## 2023-07-25 DIAGNOSIS — Z87891 Personal history of nicotine dependence: Secondary | ICD-10-CM

## 2023-07-25 DIAGNOSIS — F17210 Nicotine dependence, cigarettes, uncomplicated: Secondary | ICD-10-CM

## 2023-09-12 DIAGNOSIS — R051 Acute cough: Secondary | ICD-10-CM | POA: Diagnosis not present

## 2023-09-12 DIAGNOSIS — J441 Chronic obstructive pulmonary disease with (acute) exacerbation: Secondary | ICD-10-CM | POA: Diagnosis not present

## 2023-09-12 DIAGNOSIS — Z03818 Encounter for observation for suspected exposure to other biological agents ruled out: Secondary | ICD-10-CM | POA: Diagnosis not present

## 2023-09-17 ENCOUNTER — Emergency Department
Admission: EM | Admit: 2023-09-17 | Discharge: 2023-09-17 | Disposition: A | Discharge status: Against Medical Advice | Source: Ambulatory Visit | Attending: Emergency Medicine | Admitting: Emergency Medicine

## 2023-09-17 ENCOUNTER — Emergency Department

## 2023-09-17 ENCOUNTER — Other Ambulatory Visit: Payer: Self-pay

## 2023-09-17 DIAGNOSIS — J439 Emphysema, unspecified: Secondary | ICD-10-CM | POA: Diagnosis not present

## 2023-09-17 DIAGNOSIS — I10 Essential (primary) hypertension: Secondary | ICD-10-CM | POA: Diagnosis not present

## 2023-09-17 DIAGNOSIS — R079 Chest pain, unspecified: Secondary | ICD-10-CM | POA: Diagnosis not present

## 2023-09-17 DIAGNOSIS — J441 Chronic obstructive pulmonary disease with (acute) exacerbation: Secondary | ICD-10-CM | POA: Diagnosis not present

## 2023-09-17 DIAGNOSIS — Z5329 Procedure and treatment not carried out because of patient's decision for other reasons: Secondary | ICD-10-CM | POA: Diagnosis not present

## 2023-09-17 DIAGNOSIS — R0602 Shortness of breath: Secondary | ICD-10-CM | POA: Diagnosis not present

## 2023-09-17 DIAGNOSIS — R918 Other nonspecific abnormal finding of lung field: Secondary | ICD-10-CM | POA: Diagnosis not present

## 2023-09-17 DIAGNOSIS — J40 Bronchitis, not specified as acute or chronic: Secondary | ICD-10-CM | POA: Diagnosis not present

## 2023-09-17 LAB — CBC
HCT: 44.6 % (ref 36.0–46.0)
Hemoglobin: 15.3 g/dL — ABNORMAL HIGH (ref 12.0–15.0)
MCH: 31.9 pg (ref 26.0–34.0)
MCHC: 34.3 g/dL (ref 30.0–36.0)
MCV: 92.9 fL (ref 80.0–100.0)
Platelets: 325 K/uL (ref 150–400)
RBC: 4.8 MIL/uL (ref 3.87–5.11)
RDW: 13.1 % (ref 11.5–15.5)
WBC: 14.9 K/uL — ABNORMAL HIGH (ref 4.0–10.5)
nRBC: 0 % (ref 0.0–0.2)

## 2023-09-17 LAB — BASIC METABOLIC PANEL WITH GFR
Anion gap: 12 (ref 5–15)
BUN: 13 mg/dL (ref 8–23)
CO2: 24 mmol/L (ref 22–32)
Calcium: 9.6 mg/dL (ref 8.9–10.3)
Chloride: 100 mmol/L (ref 98–111)
Creatinine, Ser: 0.63 mg/dL (ref 0.44–1.00)
GFR, Estimated: >60 mL/min (ref >60)
Glucose, Bld: 137 mg/dL — ABNORMAL HIGH (ref 70–99)
Potassium: 3.7 mmol/L (ref 3.5–5.1)
Sodium: 136 mmol/L (ref 135–145)

## 2023-09-17 LAB — RESP PANEL BY RT-PCR (RSV, FLU A&B, COVID)  RVPGX2
Influenza A by PCR: NEGATIVE
Influenza B by PCR: NEGATIVE
Resp Syncytial Virus by PCR: NEGATIVE
SARS Coronavirus 2 by RT PCR: NEGATIVE

## 2023-09-17 LAB — TROPONIN I (HIGH SENSITIVITY): Troponin I (High Sensitivity): 4 ng/L (ref <18)

## 2023-09-17 MED ORDER — IOHEXOL 350 MG/ML SOLN
75.0000 mL | Freq: Once | INTRAVENOUS | Status: AC | PRN
  Administered 2023-09-17: 75 mL via INTRAVENOUS

## 2023-09-17 MED ORDER — IPRATROPIUM-ALBUTEROL 0.5-2.5 (3) MG/3ML IN SOLN
3.0000 mL | Freq: Once | RESPIRATORY_TRACT | Status: AC
  Administered 2023-09-17: 3 mL via RESPIRATORY_TRACT
  Filled 2023-09-17: qty 3

## 2023-09-17 NOTE — ED Notes (Addendum)
 Pt left AMA. Another RN witnessed pt taking out her IV and leaving. AMA paper signed by pt and witnessed by YASMIN, RN and security.

## 2023-09-17 NOTE — ED Notes (Signed)
 CCMD called by this RN to put pt on cardiac monitoring

## 2023-09-17 NOTE — ED Triage Notes (Signed)
 Pt arrives from [facility] with c/o SOB for over 1 week. Pt was seen on 7/2 and was given a zpack that she finished yesterday and came to be seen because she isn't feeling any better. Pt SpO2 was 87% on RA, pt put on 2L [place] and SpO2 came up to 91.Pt is A&Ox4 during triage.

## 2023-09-17 NOTE — ED Provider Notes (Signed)
 [facility] Provider Note    Event Date/Time   First MD Initiated Contact with Patient 09/17/23 1746     (approximate)   History   Shortness of Breath   HPI Amanda Nelson is a 73 y.o. female with history of HTN, HLD presenting today for shortness of breath.  Patient reports she has had shortness of breath over the past 1 week.  Originally seen 5 days ago and started on a Z-Pak and return because she was not feeling any better.  On arrival here her oxygen  on room air was 86-87% and placed on 2 L.  She reports breathing symptoms have worsened along with productive cough with yellow sputum.  Completely finished her Z-Pak.  Has some occasional chest pain from coughing.  Otherwise denies leg pain, leg swelling, abdominal pain, nausea, vomiting.  Has been using albuterol  at home without benefit.  Does admit to smoking history and questionable diagnosis of COPD.     Physical Exam   Triage Vital Signs: ED Triage Vitals  Encounter Vitals Group     BP 09/17/23 1454 139/78     Girls Systolic BP Percentile --      Girls Diastolic BP Percentile --      Boys Systolic BP Percentile --      Boys Diastolic BP Percentile --      Pulse Rate 09/17/23 1454 (!) 58     Resp 09/17/23 1454 18     Temp 09/17/23 1454 98.3 F (36.8 C)     Temp src --      SpO2 09/17/23 1454 94 %     Weight 09/17/23 1456 105 lb (47.6 kg)     Height 09/17/23 1456 5' (1.524 m)     Head Circumference --      Peak Flow --      Pain Score 09/17/23 1455 0     Pain Loc --      Pain Education --      Exclude from Growth Chart --     Most recent vital signs: Vitals:   09/17/23 2030 09/17/23 2045  BP: 136/70   Pulse:  (!) 59  Resp:  20  Temp:    SpO2:  96%   Physical Exam: I have reviewed the vital signs and nursing notes. General: Awake, alert, no acute distress.  Nontoxic appearing. Head:  Atraumatic, normocephalic.   ENT:  EOM intact, PERRL. Oral mucosa is pink and moist with no  lesions. Neck: Neck is supple with full range of motion, No meningeal signs. Cardiovascular:  RRR, No murmurs. Peripheral pulses palpable and equal bilaterally. Respiratory:  Symmetrical chest wall expansion.  Mild tachypnea associated with expiratory wheeze minimally throughout Musculoskeletal:  No cyanosis or edema. Moving extremities with full ROM Abdomen:  Soft, nontender, nondistended. Neuro:  GCS 15, moving all four extremities, interacting appropriately. Speech clear. Psych:  Calm, appropriate.   Skin:  Warm, dry, no rash.    ED Results / Procedures / Treatments   Labs (all labs ordered are listed, but only abnormal results are displayed) Labs Reviewed  BASIC METABOLIC PANEL WITH GFR - Abnormal; Notable for the following components:      Result Value   Glucose, Bld 137 (*)    All other components within normal limits  CBC - Abnormal; Notable for the following components:   WBC 14.9 (*)    Hemoglobin 15.3 (*)    All other components within normal limits  RESP PANEL BY RT-PCR (RSV,  FLU A&B, COVID)  [id number]  TROPONIN I (HIGH SENSITIVITY)     EKG My EKG interpretation: Rate of 58, sinus bradycardia.  Normal axis, normal intervals.  No acute ST elevations or depressions   RADIOLOGY Independently interpreted chest x-ray with no acute pathology.  Independently interpreted CTA chest with no evidence of PE.  Possible reactive airway versus bronchopneumonia   PROCEDURES:  Critical Care performed: No  Procedures   MEDICATIONS ORDERED IN ED: Medications  ipratropium-albuterol  (DUONEB) 0.5-2.5 (3) MG/3ML nebulizer solution 3 mL (3 mLs Nebulization Given 09/17/23 1836)  iohexol  (OMNIPAQUE ) 350 MG/ML injection 75 mL (75 mLs Intravenous Contrast Given 09/17/23 1917)     IMPRESSION / MDM / ASSESSMENT AND PLAN / ED COURSE  I reviewed the triage vital signs and the nursing notes.                              Differential diagnosis includes, but is not limited to, COPD  exacerbation, COVID, flu, RSV, other viral URI, pneumonia, PE  Patient's presentation is most consistent with acute presentation with potential threat to life or bodily function.  Patient is a 73 year old female presenting today for worsening shortness of breath in the setting of recent cough and congestion symptoms.  Does have COPD but no prior oxygen  requirements.  Was 86 to 87% on room air here at rest with tachypnea.  Does have mild expiratory wheeze throughout will give DuoNeb treatment.  White blood cell count elevated at 14.9 although may be from infection versus the prednisone .  Chest x-ray shows no obvious pneumonia.  Given no obvious pneumonia seen and worsening symptoms, will get CTA chest to rule out occult pneumonia versus PE.  CTA of chest shows no evidence of PE but possible reactive airway disease versus bronchopneumonia.  Troponin negative and negative for COVID, flu, RSV.  Before I could get back to reassess patient for further evaluation, patient had pulled out her IV and left the ED.  I was caring for a critical patient spending prolonged time in another room and unable to get back to her.  Staff reported leaving in no acute distress or respiratory difficulty.  ***The patient is on the cardiac monitor to evaluate for evidence of arrhythmia and/or significant heart rate changes.     FINAL CLINICAL IMPRESSION(S) / ED DIAGNOSES   Final diagnoses:  COPD exacerbation (HCC)     Rx / DC Orders   ED Discharge Orders     None        Note:  This document was prepared using Dragon voice recognition software and may include unintentional dictation errors.

## 2023-10-15 ENCOUNTER — Telehealth: Payer: Self-pay | Admitting: Family Medicine

## 2023-10-15 ENCOUNTER — Other Ambulatory Visit: Payer: Self-pay

## 2023-10-15 DIAGNOSIS — I10 Essential (primary) hypertension: Secondary | ICD-10-CM

## 2023-10-15 MED ORDER — AMLODIPINE BESYLATE 5 MG PO TABS: 5.0000 mg | ORAL_TABLET | Freq: Every day | ORAL | 3 refills | Status: AC

## 2023-10-15 MED ORDER — LISINOPRIL 10 MG PO TABS: 10.0000 mg | ORAL_TABLET | Freq: Every day | ORAL | 3 refills | Status: AC

## 2023-10-15 MED ORDER — ROSUVASTATIN CALCIUM 20 MG PO TABS: 20.0000 mg | ORAL_TABLET | Freq: Every day | ORAL | 3 refills | Status: AC

## 2023-10-15 NOTE — Telephone Encounter (Signed)
Converted to refill request 

## 2023-10-15 NOTE — Telephone Encounter (Addendum)
 [facility] faxed refill request for the following medications:   rosuvastatin  (CRESTOR ) 20 MG tablet    lisinopril  (ZESTRIL ) 10 MG tablet     amLODipine  (NORVASC ) 5 MG tablet    Please advise.

## 2023-10-18 ENCOUNTER — Telehealth: Payer: Self-pay

## 2023-10-18 NOTE — Telephone Encounter (Signed)
 Copied from CRM [id number]. Topic: General - Other >> Oct 18, 2023  1:29 PM DeAngela L wrote: Reason for CRM: Alm with [facility] calling after there was an alert for medication gap after diagnosis of COPD, they will send a fax request about this concern  Phone [phone]

## 2023-10-24 ENCOUNTER — Ambulatory Visit: Payer: Medicare HMO | Admitting: Family Medicine

## 2024-04-18 NOTE — Progress Notes (Signed)
 Amanda Nelson                                          MRN: [id number]   04/18/2024   The [facility] Quality Team Specialist reviewed this patient medical record for the purposes of chart review for care gap closure. The following were reviewed: chart review for care gap closure-controlling blood pressure.    [facility] Quality Team

## 2024-04-29 ENCOUNTER — Ambulatory Visit: Payer: Medicare HMO
